# Patient Record
Sex: Male | Born: 1960
Health system: Southern US, Community
[De-identification: ages and names within clinical notes are randomized; demographics above are authoritative.]

## PROBLEM LIST (undated history)

## (undated) DIAGNOSIS — K2980 Duodenitis without bleeding: Secondary | ICD-10-CM

## (undated) DIAGNOSIS — N200 Calculus of kidney: Secondary | ICD-10-CM

---

## 1998-07-31 ENCOUNTER — Emergency Department (HOSPITAL_COMMUNITY): Admission: EM | Admit: 1998-07-31 | Discharge: 1998-07-31 | Payer: Self-pay | Admitting: Emergency Medicine

## 1998-07-31 ENCOUNTER — Encounter: Payer: Self-pay | Admitting: Emergency Medicine

## 1999-01-22 ENCOUNTER — Emergency Department (HOSPITAL_COMMUNITY): Admission: EM | Admit: 1999-01-22 | Discharge: 1999-01-22 | Payer: Self-pay | Admitting: Emergency Medicine

## 1999-01-31 ENCOUNTER — Emergency Department (HOSPITAL_COMMUNITY): Admission: EM | Admit: 1999-01-31 | Discharge: 1999-01-31 | Payer: Self-pay | Admitting: Emergency Medicine

## 2000-08-14 ENCOUNTER — Emergency Department (HOSPITAL_COMMUNITY): Admission: EM | Admit: 2000-08-14 | Discharge: 2000-08-14 | Payer: Self-pay | Admitting: Emergency Medicine

## 2002-04-20 ENCOUNTER — Ambulatory Visit (HOSPITAL_COMMUNITY): Admission: RE | Admit: 2002-04-20 | Discharge: 2002-04-20 | Payer: Self-pay | Admitting: Gastroenterology

## 2002-11-23 ENCOUNTER — Ambulatory Visit (HOSPITAL_COMMUNITY): Admission: RE | Admit: 2002-11-23 | Discharge: 2002-11-23 | Payer: Self-pay | Admitting: Family Medicine

## 2002-11-23 ENCOUNTER — Encounter: Payer: Self-pay | Admitting: Family Medicine

## 2005-05-08 ENCOUNTER — Ambulatory Visit (HOSPITAL_COMMUNITY): Admission: RE | Admit: 2005-05-08 | Discharge: 2005-05-08 | Payer: Self-pay | Admitting: General Surgery

## 2005-05-08 ENCOUNTER — Ambulatory Visit (HOSPITAL_BASED_OUTPATIENT_CLINIC_OR_DEPARTMENT_OTHER): Admission: RE | Admit: 2005-05-08 | Discharge: 2005-05-08 | Payer: Self-pay | Admitting: General Surgery

## 2005-05-08 ENCOUNTER — Encounter (INDEPENDENT_AMBULATORY_CARE_PROVIDER_SITE_OTHER): Payer: Self-pay | Admitting: *Deleted

## 2006-06-03 ENCOUNTER — Emergency Department (HOSPITAL_COMMUNITY): Admission: EM | Admit: 2006-06-03 | Discharge: 2006-06-04 | Payer: Self-pay | Admitting: Emergency Medicine

## 2009-04-17 ENCOUNTER — Emergency Department (HOSPITAL_COMMUNITY): Admission: EM | Admit: 2009-04-17 | Discharge: 2009-04-17 | Payer: Self-pay | Admitting: Emergency Medicine

## 2009-04-21 ENCOUNTER — Encounter: Admission: RE | Admit: 2009-04-21 | Discharge: 2009-04-21 | Payer: Self-pay | Admitting: Family Medicine

## 2010-12-16 LAB — URINALYSIS, ROUTINE W REFLEX MICROSCOPIC
Ketones, ur: 15 mg/dL — AB
Leukocytes, UA: NEGATIVE
Nitrite: NEGATIVE
Specific Gravity, Urine: 1.027 (ref 1.005–1.030)
Urobilinogen, UA: 0.2 mg/dL (ref 0.0–1.0)
pH: 5 (ref 5.0–8.0)

## 2010-12-16 LAB — URINE MICROSCOPIC-ADD ON

## 2011-01-26 NOTE — Op Note (Signed)
NAME:  Edward Gordon, Edward Gordon NO.:  [id number]   MEDICAL RECORD NO.:  [id number]          PATIENT TYPE:  AMB   LOCATION:  DSC                          FACILITY:  [facility]   PHYSICIAN:  Cherylynn Ridges, M.D.    DATE OF BIRTH:  1961/03/07   DATE OF PROCEDURE:  DATE OF DISCHARGE:                                 OPERATIVE REPORT   PREOPERATIVE DIAGNOSIS:  Pilonidal cyst disease.   POSTOPERATIVE DIAGNOSIS:  Pilonidal cyst disease.   OPERATION/PROCEDURE:  Excision of pilonidal cyst and sinus.   SURGEON:  Cherylynn Ridges, M.D.   ANESTHESIA:  General endotracheal anesthesia.   ESTIMATED BLOOD LOSS:  Less than 50 mL.   COMPLICATIONS:  None.   CONDITION:  Stable.   SPECIMENS:  Excised pilonidal disease area with skin.   INDICATIONS:  The patient is a 50 year old who has had recurrent pilonidal  cyst attacks with drainage.  Now comes in for definitive therapy.   FINDINGS:  The patient had minimal inflammation at the current time.  There  was induration in the sort of immediate posterior coccygeal area without any  evidence of infection.   DESCRIPTION OF PROCEDURE:  The patient was taken to the operating room,  placed on the table in the supine position.  An adequate general anesthesia  was administered.  He was flipped into the jackknife prone position and  placed on the table.  He was prepped with Betadine in the usual sterile  manner.  We marked the area of incision which ultimately measured 13 x 2-3  cm in size.  We made an oval incision around the diseased area using a #10  blade and then subsequently dissected it out down to the fascia using  electrocautery.  Hemostasis was obtained with electrocautery.  Once we  completely excised the tissue and sent it off as specimen, we attained  adequate hemostasis.  We injected with 0.25% Marcaine with epinephrine into  the wound and then we closed in three layers; a deep subcutaneous layer with  2-0 Vicryl interrupted simple  stitches  Placed.  A more superficial subcutaneous layer of tension-relieving 3-0  Vicryl was placed in the subcutaneous.  The skin was closed using  interrupted vertical mattress sutures of 2-0 nylon.  Sterile dressing was  applied including Betadine ointment.  All counts were correct.      Cherylynn Ridges, M.D.  Electronically Signed     JOW/MEDQ  D:  05/08/2005  T:  05/08/2005  Job:  [id number]

## 2011-01-26 NOTE — Op Note (Signed)
   NAME:  Edward Gordon, Edward Gordon                          ACCOUNT NO.:  [id number]   MEDICAL RECORD NO.:  [id number]                   PATIENT TYPE:  AMB   LOCATION:  ENDO                                 FACILITY:  [facility]   PHYSICIAN:  James L. Malon Kindle., M.D.          DATE OF BIRTH:  11-30-1960   DATE OF PROCEDURE:  04/20/2002  DATE OF DISCHARGE:                                 OPERATIVE REPORT   PROCEDURE:  Colonoscopy.   MEDICATIONS:  Fentanyl 80 mcg, Versed 8 mg IV.   ENDOSCOPE:  Olympus adult video colonoscope.   INDICATIONS:  Strong family history of colon cancer in the father,  grandmother, and multiple other family members have had colon polyps.  This  is done as a screening procedure.   DESCRIPTION OF PROCEDURE:  The procedure had been explained to the patient  and consent obtained.  The patient was in the left lateral decubitus  position and digital exam was performed, and the Olympus adult scope was  inserted and advanced under direct visualization.  The scope was withdrawn  after the cecum had been reached.  The ileocecal valve and appendiceal  orifice were seen.  The cecum, ascending colon, hepatic flexure, transverse  colon, splenic flexure, descending, and sigmoid colon were seen well upon  withdrawal, and no polyps were seen.  No significant diverticular disease.  There were moderate internal hemorrhoids seen in the rectum upon removal of  the scope.  The scope was withdrawn.  The patient tolerated the procedure  well.   ASSESSMENT:  1. No evidence of colon  polyps.  2. Internal hemorrhoids.   PLAN:  Will recommend repeating in five years and will recommend yearly  Hemoccults.                                                 James L. Malon Kindle., M.D.    Waldron Session  D:  04/20/2002  T:  04/22/2002  Job:  [id number]   cc:   Earl Lites D. Marina Goodell, M.D.

## 2011-01-29 ENCOUNTER — Emergency Department (HOSPITAL_BASED_OUTPATIENT_CLINIC_OR_DEPARTMENT_OTHER)
Admission: EM | Admit: 2011-01-29 | Discharge: 2011-01-29 | Disposition: A | Discharge status: Other Acute Inpatient Hospital | Payer: BC Managed Care – PPO | Attending: Emergency Medicine | Admitting: Emergency Medicine

## 2011-01-29 ENCOUNTER — Inpatient Hospital Stay (HOSPITAL_COMMUNITY)
Admission: AD | Admit: 2011-01-29 | Discharge: 2011-02-01 | DRG: 183 | Disposition: A | Discharge status: Home / Self Care | Payer: BC Managed Care – PPO | Source: Other Acute Inpatient Hospital | Attending: Internal Medicine | Admitting: Internal Medicine

## 2011-01-29 ENCOUNTER — Emergency Department (INDEPENDENT_AMBULATORY_CARE_PROVIDER_SITE_OTHER): Payer: BC Managed Care – PPO

## 2011-01-29 DIAGNOSIS — T3995XA Adverse effect of unspecified nonopioid analgesic, antipyretic and antirheumatic, initial encounter: Secondary | ICD-10-CM | POA: Diagnosis present

## 2011-01-29 DIAGNOSIS — R109 Unspecified abdominal pain: Secondary | ICD-10-CM

## 2011-01-29 DIAGNOSIS — Z87442 Personal history of urinary calculi: Secondary | ICD-10-CM

## 2011-01-29 DIAGNOSIS — K219 Gastro-esophageal reflux disease without esophagitis: Secondary | ICD-10-CM | POA: Diagnosis present

## 2011-01-29 DIAGNOSIS — K5900 Constipation, unspecified: Secondary | ICD-10-CM | POA: Diagnosis present

## 2011-01-29 DIAGNOSIS — R112 Nausea with vomiting, unspecified: Secondary | ICD-10-CM | POA: Diagnosis present

## 2011-01-29 DIAGNOSIS — M549 Dorsalgia, unspecified: Secondary | ICD-10-CM

## 2011-01-29 DIAGNOSIS — K2980 Duodenitis without bleeding: Principal | ICD-10-CM | POA: Diagnosis present

## 2011-01-29 LAB — BASIC METABOLIC PANEL
BUN: 10 mg/dL (ref 6–23)
CO2: 22 mEq/L (ref 19–32)
Chloride: 103 mEq/L (ref 96–112)
GFR calc non Af Amer: >60 mL/min (ref >60)
Glucose, Bld: 100 mg/dL — ABNORMAL HIGH (ref 70–99)
Potassium: 3.9 mEq/L (ref 3.5–5.1)
Sodium: 139 mEq/L (ref 135–145)

## 2011-01-29 LAB — HEPATIC FUNCTION PANEL
ALT: 26 U/L (ref 0–53)
AST: 26 U/L (ref 0–37)
Alkaline Phosphatase: 116 U/L (ref 39–117)
Bilirubin, Direct: 0.2 mg/dL (ref 0.0–0.3)
Total Bilirubin: 0.7 mg/dL (ref 0.3–1.2)

## 2011-01-29 LAB — CBC
HCT: 43.5 % (ref 39.0–52.0)
MCH: 30 pg (ref 26.0–34.0)
MCH: 30.2 pg (ref 26.0–34.0)
MCHC: 33.7 g/dL (ref 30.0–36.0)
MCV: 86 fL (ref 78.0–100.0)
MCV: 88.9 fL (ref 78.0–100.0)
Platelets: 266 10*3/uL (ref 150–400)
RBC: 4.67 MIL/uL (ref 4.22–5.81)
RBC: 5.06 MIL/uL (ref 4.22–5.81)
WBC: 16.3 10*3/uL — ABNORMAL HIGH (ref 4.0–10.5)

## 2011-01-29 LAB — DIFFERENTIAL
Eosinophils Absolute: 0.2 10*3/uL (ref 0.0–0.7)
Lymphocytes Relative: 6 % — ABNORMAL LOW (ref 12–46)
Lymphs Abs: 1 10*3/uL (ref 0.7–4.0)
Monocytes Relative: 9 % (ref 3–12)
Neutrophils Relative %: 84 % — ABNORMAL HIGH (ref 43–77)

## 2011-01-29 LAB — URINALYSIS, ROUTINE W REFLEX MICROSCOPIC
Bilirubin Urine: NEGATIVE
Glucose, UA: NEGATIVE mg/dL
Hgb urine dipstick: NEGATIVE
Ketones, ur: NEGATIVE mg/dL
Specific Gravity, Urine: 1.009 (ref 1.005–1.030)
pH: 7.5 (ref 5.0–8.0)

## 2011-01-29 MED ORDER — IOHEXOL 300 MG/ML  SOLN
100.0000 mL | Freq: Once | INTRAMUSCULAR | Status: AC | PRN
  Administered 2011-01-29: 100 mL via INTRAVENOUS

## 2011-01-30 LAB — GLUCOSE, CAPILLARY: Glucose-Capillary: 89 mg/dL (ref 70–99)

## 2011-01-30 LAB — COMPREHENSIVE METABOLIC PANEL
Albumin: 3 g/dL — ABNORMAL LOW (ref 3.5–5.2)
BUN: 9 mg/dL (ref 6–23)
CO2: 23 mEq/L (ref 19–32)
Calcium: 8.8 mg/dL (ref 8.4–10.5)
Chloride: 102 mEq/L (ref 96–112)
Creatinine, Ser: 1.21 mg/dL (ref 0.4–1.5)
GFR calc non Af Amer: >60 mL/min (ref >60)
Total Bilirubin: 0.7 mg/dL (ref 0.3–1.2)

## 2011-01-31 LAB — LIPASE, BLOOD: Lipase: 39 U/L (ref 11–59)

## 2011-01-31 LAB — BASIC METABOLIC PANEL
Calcium: 8.3 mg/dL — ABNORMAL LOW (ref 8.4–10.5)
GFR calc Af Amer: >60 mL/min (ref >60)
GFR calc non Af Amer: >60 mL/min (ref >60)
Sodium: 134 mEq/L — ABNORMAL LOW (ref 135–145)

## 2011-01-31 LAB — GLUCOSE, CAPILLARY: Glucose-Capillary: 110 mg/dL — ABNORMAL HIGH (ref 70–99)

## 2011-01-31 LAB — H. PYLORI ANTIBODY, IGG: H Pylori IgG: <0.4 {ISR}

## 2011-01-31 LAB — CBC
MCHC: 34.5 g/dL (ref 30.0–36.0)
RDW: 12.3 % (ref 11.5–15.5)

## 2011-02-01 LAB — BASIC METABOLIC PANEL
Calcium: 8.6 mg/dL (ref 8.4–10.5)
GFR calc Af Amer: >60 mL/min (ref >60)
GFR calc non Af Amer: >60 mL/min (ref >60)
Glucose, Bld: 119 mg/dL — ABNORMAL HIGH (ref 70–99)
Sodium: 133 mEq/L — ABNORMAL LOW (ref 135–145)

## 2011-02-01 LAB — CBC
MCHC: 34.5 g/dL (ref 30.0–36.0)
RDW: 12.2 % (ref 11.5–15.5)

## 2011-02-04 ENCOUNTER — Emergency Department (HOSPITAL_BASED_OUTPATIENT_CLINIC_OR_DEPARTMENT_OTHER)
Admission: EM | Admit: 2011-02-04 | Discharge: 2011-02-04 | Disposition: A | Discharge status: Home / Self Care | Payer: BC Managed Care – PPO | Attending: Emergency Medicine | Admitting: Emergency Medicine

## 2011-02-04 ENCOUNTER — Emergency Department (INDEPENDENT_AMBULATORY_CARE_PROVIDER_SITE_OTHER): Payer: BC Managed Care – PPO

## 2011-02-04 DIAGNOSIS — Z79899 Other long term (current) drug therapy: Secondary | ICD-10-CM | POA: Insufficient documentation

## 2011-02-04 DIAGNOSIS — K219 Gastro-esophageal reflux disease without esophagitis: Secondary | ICD-10-CM | POA: Insufficient documentation

## 2011-02-04 DIAGNOSIS — M79609 Pain in unspecified limb: Secondary | ICD-10-CM

## 2011-02-04 DIAGNOSIS — IMO0002 Reserved for concepts with insufficient information to code with codable children: Secondary | ICD-10-CM | POA: Insufficient documentation

## 2011-02-04 DIAGNOSIS — M7989 Other specified soft tissue disorders: Secondary | ICD-10-CM

## 2011-02-04 LAB — DIFFERENTIAL
Basophils Absolute: 0.1 10*3/uL (ref 0.0–0.1)
Eosinophils Relative: 6 % — ABNORMAL HIGH (ref 0–5)
Lymphocytes Relative: 23 % (ref 12–46)
Neutro Abs: 6 10*3/uL (ref 1.7–7.7)

## 2011-02-04 LAB — BASIC METABOLIC PANEL
CO2: 23 mEq/L (ref 19–32)
Calcium: 9.7 mg/dL (ref 8.4–10.5)
GFR calc Af Amer: >60 mL/min (ref >60)
GFR calc non Af Amer: >60 mL/min (ref >60)
Sodium: 142 mEq/L (ref 135–145)

## 2011-02-04 LAB — CBC
Hemoglobin: 15.2 g/dL (ref 13.0–17.0)
MCHC: 35.2 g/dL (ref 30.0–36.0)

## 2011-02-07 NOTE — Discharge Summary (Signed)
NAME:  Edward Gordon, Edward Gordon              ACCOUNT NO.:  [id number]  MEDICAL RECORD NO.:  [id number]           PATIENT TYPE:  I  LOCATION:  1343                         FACILITY:  [facility]  PHYSICIAN:  Thad Ranger, MD       DATE OF BIRTH:  [birth year]  DATE OF ADMISSION:  01/29/2011 DATE OF DISCHARGE:                        DISCHARGE SUMMARY - REFERRING   PRIMARY CARE PHYSICIAN:  Tally Joe, M.D.  DISCHARGE DIAGNOSES: 1. Acute duodenitis. 2. Severe gastroesophageal reflux disease. 3. History of kidney stones. 4. Abdominal pain with nausea and vomiting, improved.  CONSULTATIONS:  Gastroenterology, Graylin Shiver, M.D.  DISCHARGE MEDICATIONS: 1. Ciprofloxacin 500 mg p.o. b.i.d. for 7 days. 2. Flagyl 500 mg p.o. t.i.d. with meals for 7 days. 3. Percocet 5/325 mg one to two tablets p.o. every 6 hours as needed     for pain. 4. Phenergan 12.5 mg p.o. every 6 hours as needed for nausea/vomiting. 5. Protonix 40 mg p.o. b.i.d. with meals. 6. Sucralfate 1 g p.o. with meals and at bedtime. 7. Tramadol 50 mg p.o. every 8 hours as needed for pain. 8. Metamucil daily. 9. Multivitamin one tablet p.o. daily. 10.Tacrolimus topical daily as needed.  The patient was counseled to stop the following medications which include:  Ibuprofen, Nexium and doxycycline.  BRIEF HISTORY OF PRESENT ILLNESS:  At the time of admission Edward Gordon is a 50 year old male with history of severe GERD who came in with severe abdominal pain, presumed to be kidney stones that he had in the past.  He had associated nausea, vomiting and flank pain.  He denied any fever or chills.  He also felt constipated, but no diarrhea, no hematemesis, no melena, and no bright red blood per rectum.  RADIOLOGICAL DATA:  CT of the abdomen and pelvis on May 21 showed: 1. Diffuse inflammatory process within the third portion of the     duodenum, compatible with acute duodenitis. 2. Minimal fullness of the right renal collecting system,  may be     secondary to some impact on the proximal right ureter which extends     into the inflammatory retroperitoneal process. 3. Extensive fluid and inflammatory change within the retroperitoneum     extending inferiorly from the level of duodenum. 4. No definitive free air to suggest perforation. 5. Several segments in the midportion of the small bowel, left distal     small bowel, and colon appears to be normal.  Ill-defined airspace     disease in the right middle lobe.  While this may represent     atelectasis, infection is also considered.  BRIEF HOSPITALIZATION COURSE:  Edward Gordon is a 50 year old male with history of severe GERD who presented with abdominal pain, nausea, and vomiting. Acute duodenitis with severe GERD, likely secondary to his NSAID use. Duodenitis is somewhat unclear in etiology.  The patient was admitted to the Medicine Service.  He was started on ciprofloxacin and Flagyl IV. The patient was initially placed on n.p.o. status, IV fluids and IV Protonix drip.  Gastroenterology was consulted and the patient was followed by Dr. Evette Cristal.  He did not require EGD during the hospitalization  secondary to marked improvement in his symptoms.  At the time of discharge, the patient is eating a regular diet without any difficulty.  He will follow up with Gastroenterology within the next one to two weeks and likely need a screening endoscopy given his long history of GERD for any peptic ulcer disease, any duodenal ulcer or Barrett's esophagus.  This was clearly explained to the patient by myself at the time of discharge.  PHYSICAL EXAMINATION:  VITAL SIGNS:  At the time of the dictation, blood pressure 135/87, temperature 98.8, pulse 79, respirations 16, O2 saturations 98% on room air. GENERAL:  The patient is alert, awake and oriented x3, not in acute distress. HEENT:  Anicteric sclerae.  Pink conjunctivae.  Pupils reactive to light and accommodation.  EOMI. NECK:   Supple.  No lymphopathy.  No JVD. CARDIOVASCULAR:  S1 and S2 regular.  Regular rate and rhythm. CHEST:  Clear to auscultation bilaterally. ABDOMEN:  Soft, nontender, normal bowel sounds. EXTREMITIES:  No cyanosis, clubbing or edema noted in the upper or lower extremities bilaterally.  DISCHARGE FOLLOWUP:  Follow up with Dr. Azucena Cecil in the next two weeks and [facility], Dr. Evette Cristal, in the next two weeks.  Discharge time 35 minutes.     Thad Ranger, MD     RR/MEDQ  D:  02/01/2011  T:  02/01/2011  Job:  [id number]  cc:   Tally Joe, M.D. Fax: [phone]  Graylin Shiver, M.D. Fax: [phone]  Electronically Signed by Jules Vidovich  on 02/07/2011 10:28:07 AM

## 2011-02-07 NOTE — Consult Note (Signed)
NAME:  Edward Gordon, Edward Gordon NO.:  [id number]  MEDICAL RECORD NO.:  [id number]           PATIENT TYPE:  LOCATION:                                 FACILITY:  PHYSICIAN:  Graylin Shiver, M.D.   DATE OF BIRTH:  [birth year]  DATE OF CONSULTATION: DATE OF DISCHARGE:                                CONSULTATION   REASON FOR CONSULTATION:  The patient is a 50 year old male who was admitted to the hospital yesterday after having gone to the emergency room at [facility] because of ongoing abdominal pain.  The patient states that he had been experiencing severe abdominal pain for approximately 4 days which did not seem to get better, so he went to the emergency room.  He thought that this pain was related to kidney stones which he has had in the past, although those pains from kidney stones were in his back.  The pain this time in his abdomen was more in the mid abdomen.  It was associated with nausea and 1 episode of vomiting, but no hematemesis.  There was no diarrhea.  When he went to the emergency room, he was found to have an elevated white blood cell count of 16,300. Hemoglobin was 15.3.  A CT scan of the abdomen and pelvis was done, which showed a diffuse inflammatory process within the third portion of the duodenum compatible with acute duodenitis.  There was extensive fluid and inflammatory change within the retroperitoneum extending inferiorly from the level of the duodenum.  There was no definite free air to suggest perforation.  The patient states that when he first got this abdominal pain, he took 5 ibuprofen.  Currently having been in the hospital now for about a little over 12 hours, he is feeling better.  He was started on a proton pump inhibitor as well as 2 antibiotics, Cipro and Flagyl.  He states that the abdominal pain is better and he is not nauseated or vomiting today.  ALLERGIES:  PENICILLIN.  MEDICATIONS PRIOR TO ADMISSION:  Doxycycline and  omeprazole.  PAST MEDICAL HISTORY:  Positive for GERD and kidney stones.  There is also a history of MRSA.  SOCIAL HISTORY:  He does not smoke or drink alcohol.  REVIEW OF SYSTEMS:  Systems review otherwise negative.  PHYSICAL EXAMINATION:  GENERAL:  He is alert and oriented and does not appear in any acute distress. VITAL SIGNS:  Stable. HEENT:  He is nonicteric. HEART:  Regular rhythm.  No murmurs. LUNGS:  Clear. ABDOMEN:  Bowel sounds are normal.  It is soft.  He does have some tenderness along the right midabdomen, but there is no rigidity or rebound pain.  IMPRESSION:  Abdominal pain with abnormal CT scan showing evidence of duodenitis.  PLAN:  The etiology of this problem is unclear.  I would suggest that since current treatment does seem to be improving his symptoms, that it be continued.  I would continue antibiotics currently and also pantoprazole and follow him clinically.  We may consider EGD.  However, right now I think the thing we need to do is to continue treatment and follow him  clinically.          ______________________________ Graylin Shiver, M.D.     SFG/MEDQ  D:  01/30/2011  T:  01/30/2011  Job:  [id number]  cc:   [facility]  Tally Joe, M.D. Fax: [phone]  Electronically Signed by Herbert Moors MD on 02/07/2011 10:04:17 AM

## 2011-02-08 NOTE — H&P (Signed)
NAME:  Edward Gordon, Edward Gordon              ACCOUNT NO.:  [id number]  MEDICAL RECORD NO.:  [id number]           PATIENT TYPE:  I  LOCATION:  1343                         FACILITY:  [facility]  PHYSICIAN:  Lonia Blood, M.D.      DATE OF BIRTH:  06/16/1961  DATE OF ADMISSION:  01/29/2011 DATE OF DISCHARGE:                             HISTORY & PHYSICAL   PRIMARY CARE PHYSICIAN:  Dr. Inge Rise  PRESENTING COMPLAINT:  Abdominal pain, nausea, and vomiting.  HISTORY OF PRESENT ILLNESS:  The patient is a 50 year old gentleman with a history of severe GERD, who came in with a history of severe abdominal pain presumed to be kidney stones that he had in the past.  He has associated nausea, vomiting, and flank pain.  He denied any fever or chills.  He has also felt constipated, but no diarrhea.  No hematemesis. No melena.  No bright red blood per rectum.  He denied any other symptoms.  He apparently has had some treatment for MRSA and has been on doxycycline for suppression treatment.  PAST MEDICAL HISTORY:  Significant for: 1. GERD. 2. Kidney stones.  ALLERGIES:  PENICILLIN.  MEDICATIONS:  Doxycycline and Nexium.  SOCIAL HISTORY:  The patient lives here in [place].  He denied any tobacco, alcohol, or IV drug use.  FAMILY HISTORY:  He denied any significant family history.  REVIEW OF SYSTEMS:  All systems reviewed are negative except per HPI.  PHYSICAL EXAMINATION:  VITAL SIGNS:  Temperature is 100.0, blood pressure 129/83, pulse 103, respiratory rate 16, sats 97% on room air. GENERAL:  He is awake, alert, oriented.  He is in no acute distress. HEENT:  PERRL.  EOMI.  No pallor, no jaundice.  No rhinorrhea. NECK:  Supple.  No visible JVD, no lymphadenopathy. RESPIRATORY:  He has good air entry bilaterally.  No wheezes.  No rales. No crackles. CARDIOVASCULAR SYSTEM:  He has S1 and S2.  No audible murmur. ABDOMEN:  Soft, distended, full, mild epigastric tenderness.  No organomegaly with  positive bowel sounds. EXTREMITIES:  No edema, cyanosis, or clubbing. SKIN EXAM:  No rashes.  No ulcers.  LABORATORY DATA:  White count is 16.3, hemoglobin 15.3, platelet count is 298 with left shift, ANC of 13.7.  Urinalysis is negative.  Sodium is 139, potassium 3.9, chloride 103, CO2 of 22, glucose 100, BUN 10, creatinine 1.20, calcium 9.9, total protein 7.7, albumin 3.8, AST 26, ALT 26.  Lipase is 79.  CT abdomen and pelvis showed diffuse inflammatory process within the third portion of the duodenum which is compatible with an acute duodenitis.  There was minimal fullness of the right renal collecting system which may be secondary to some impact on the proximal right ureter which extended to the inflammatory retroperitoneal process, extensive fluid, and inflammatory change within the retroperitoneum extending inferiorly from the level of the duodenum. There is no definite free air to suggest perforation.  ASSESSMENT:  This is a 50 year old gentleman presenting with what appears to be acute duodenitis.  The patient is currently responding to treatment.  This could be infectious versus inflammatory in nature, but he has severe gastroesophageal  reflux disease apparently.  PLAN:  Therefore, 1. Acute duodenitis.  We will admit the patient, start him on empiric     Cipro and Flagyl and see how the patient responds to these.  In the     meantime, I will keep him n.p.o. and also consider GI consult.     Possible right middle lobe pneumonia according to his CT abdomen     and pelvis.  Again, we will repeat a chest x-ray in the morning.     If he shows definite pneumonia, we will change his antibiotics. 2. GERD.  I will continue his PPI twice a day. 3. History of kidney stones.  His CT did not show any evidence of     kidney stone at this point.     Lonia Blood, M.D.     Verlin Grills  D:  01/29/2011  T:  01/29/2011  Job:  [id number]  Electronically Signed by Lonia Blood M.D. on 02/08/2011  11:51:14 AM

## 2011-05-27 ENCOUNTER — Encounter: Payer: Self-pay | Admitting: *Deleted

## 2011-05-27 ENCOUNTER — Emergency Department (HOSPITAL_BASED_OUTPATIENT_CLINIC_OR_DEPARTMENT_OTHER)
Admission: EM | Admit: 2011-05-27 | Discharge: 2011-05-27 | Disposition: A | Discharge status: Home / Self Care | Payer: BC Managed Care – PPO | Attending: Emergency Medicine | Admitting: Emergency Medicine

## 2011-05-27 ENCOUNTER — Emergency Department (INDEPENDENT_AMBULATORY_CARE_PROVIDER_SITE_OTHER): Payer: BC Managed Care – PPO

## 2011-05-27 DIAGNOSIS — K449 Diaphragmatic hernia without obstruction or gangrene: Secondary | ICD-10-CM

## 2011-05-27 DIAGNOSIS — N390 Urinary tract infection, site not specified: Secondary | ICD-10-CM | POA: Insufficient documentation

## 2011-05-27 DIAGNOSIS — M545 Low back pain: Secondary | ICD-10-CM

## 2011-05-27 DIAGNOSIS — R109 Unspecified abdominal pain: Secondary | ICD-10-CM

## 2011-05-27 DIAGNOSIS — N200 Calculus of kidney: Secondary | ICD-10-CM

## 2011-05-27 DIAGNOSIS — N201 Calculus of ureter: Secondary | ICD-10-CM | POA: Insufficient documentation

## 2011-05-27 DIAGNOSIS — R110 Nausea: Secondary | ICD-10-CM | POA: Insufficient documentation

## 2011-05-27 HISTORY — DX: Calculus of kidney: N20.0

## 2011-05-27 LAB — URINE MICROSCOPIC-ADD ON

## 2011-05-27 LAB — URINALYSIS, ROUTINE W REFLEX MICROSCOPIC
Nitrite: POSITIVE — AB
Specific Gravity, Urine: 1.031 — ABNORMAL HIGH (ref 1.005–1.030)
Urobilinogen, UA: 1 mg/dL (ref 0.0–1.0)
pH: 5 (ref 5.0–8.0)

## 2011-05-27 LAB — BASIC METABOLIC PANEL
BUN: 17 mg/dL (ref 6–23)
Chloride: 104 mEq/L (ref 96–112)
GFR calc Af Amer: >60 mL/min (ref >60)
GFR calc non Af Amer: >60 mL/min (ref >60)
Potassium: 3.7 mEq/L (ref 3.5–5.1)

## 2011-05-27 MED ORDER — CIPROFLOXACIN HCL 500 MG PO TABS: 500.0000 mg | ORAL_TABLET | Freq: Two times a day (BID) | ORAL | Status: AC

## 2011-05-27 MED ORDER — ONDANSETRON HCL 4 MG/2ML IJ SOLN
4.0000 mg | Freq: Once | INTRAMUSCULAR | Status: AC
  Administered 2011-05-27: 4 mg via INTRAVENOUS
  Filled 2011-05-27: qty 2

## 2011-05-27 MED ORDER — ONDANSETRON HCL 4 MG PO TABS: 4.0000 mg | ORAL_TABLET | Freq: Four times a day (QID) | ORAL | Status: AC

## 2011-05-27 MED ORDER — HYDROMORPHONE HCL 1 MG/ML IJ SOLN
1.0000 mg | Freq: Once | INTRAMUSCULAR | Status: AC
  Administered 2011-05-27: 1 mg via INTRAVENOUS
  Filled 2011-05-27: qty 1

## 2011-05-27 MED ORDER — OXYCODONE-ACETAMINOPHEN 5-325 MG PO TABS: 2.0000 | ORAL_TABLET | ORAL | Status: AC | PRN

## 2011-05-27 NOTE — ED Notes (Signed)
Pt has hx of kidney stones and this one started giving him problems yesterday.

## 2011-05-27 NOTE — ED Notes (Signed)
rx x 3 for cipro, zofran and oxycodone given- family present to drive- urine strainer given for home use

## 2011-05-27 NOTE — ED Provider Notes (Signed)
History     CSN: [id number] Arrival date & time: 05/27/2011  1:22 PM   Chief Complaint  Patient presents with  . Nephrolithiasis     (Include location/radiation/quality/duration/timing/severity/associated sxs/prior treatment) HPI Comments: Pt states that he has a history of kidney stones and it feels similar:pt c/o right flank pain with radiation to the abdomen  Patient is a 50 y.o. male presenting with flank pain. The history is provided by the patient. No language interpreter was used.  Flank Pain This is a recurrent problem. The current episode started yesterday. The problem occurs intermittently. The problem has been rapidly worsening. Associated symptoms include nausea. Pertinent negatives include no fever, urinary symptoms or vomiting. The symptoms are aggravated by nothing. He has tried nothing for the symptoms.     Past Medical History  Diagnosis Date  . Kidney stones      History reviewed. No pertinent past surgical history.  History reviewed. No pertinent family history.  History  Substance Use Topics  . Smoking status: Never Smoker   . Smokeless tobacco: Not on file  . Alcohol Use: No      Review of Systems  Constitutional: Negative for fever.  Gastrointestinal: Positive for nausea. Negative for vomiting.  Genitourinary: Positive for flank pain.  All other systems reviewed and are negative.    Allergies  Review of patient's allergies indicates no known allergies.  Home Medications   Current Outpatient Rx  Name Route Sig Dispense Refill  . DOXYCYCLINE HYCLATE 100 MG PO CAPS Oral Take 100 mg by mouth daily.      Marland Kitchen PANTOPRAZOLE SODIUM 20 MG PO TBEC Oral Take 20 mg by mouth daily.        Physical Exam    BP 142/96  Pulse 64  Temp(Src) 97.5 F (36.4 C) (Oral)  Resp 20  Ht 6\' 2"  (1.88 m)  Wt 195 lb (88.451 kg)  BMI 25.04 kg/m2  SpO2 100%  Physical Exam  Nursing note and vitals reviewed. Constitutional: He is oriented to person, place, and  time. He appears well-developed and well-nourished.  HENT:  Head: Normocephalic.  Neck: Normal range of motion.  Cardiovascular: Normal rate and regular rhythm.   Pulmonary/Chest: Effort normal and breath sounds normal.  Abdominal: Soft. Bowel sounds are normal.  Musculoskeletal: Normal range of motion.  Neurological: He is alert and oriented to person, place, and time.  Skin: Skin is warm and dry.  Psychiatric: He has a normal mood and affect.    ED Course  Procedures  Results for orders placed during the hospital encounter of 05/27/11  URINALYSIS, ROUTINE W REFLEX MICROSCOPIC      Component Value Range   Color, Urine ORANGE (*) YELLOW    Appearance CLOUDY (*) CLEAR    Specific Gravity, Urine 1.031 (*) 1.005 - 1.030    pH 5.0  5.0 - 8.0    Glucose, UA NEGATIVE  NEGATIVE (mg/dL)   Hgb urine dipstick LARGE (*) NEGATIVE    Bilirubin Urine SMALL (*) NEGATIVE    Ketones, ur 15 (*) NEGATIVE (mg/dL)   Protein, ur NEGATIVE  NEGATIVE (mg/dL)   Urobilinogen, UA 1.0  0.0 - 1.0 (mg/dL)   Nitrite POSITIVE (*) NEGATIVE    Leukocytes, UA SMALL (*) NEGATIVE   BASIC METABOLIC PANEL      Component Value Range   Sodium 142  135 - 145 (mEq/L)   Potassium 3.7  3.5 - 5.1 (mEq/L)   Chloride 104  96 - 112 (mEq/L)   CO2 27  19 - 32 (mEq/L)   Glucose, Bld 95  70 - 99 (mg/dL)   BUN 17  6 - 23 (mg/dL)   Creatinine, Ser 0.86  0.50 - 1.35 (mg/dL)   Calcium 9.8  8.4 - 57.8 (mg/dL)   GFR calc non Af Amer >60  >60 (mL/min)   GFR calc Af Amer >60  >60 (mL/min)  URINE MICROSCOPIC-ADD ON      Component Value Range   Squamous Epithelial / LPF RARE  RARE    WBC, UA 3-6  <3 (WBC/hpf)   RBC / HPF TOO NUMEROUS TO COUNT  <3 (RBC/hpf)   Bacteria, UA MANY (*) RARE    Crystals CA OXALATE CRYSTALS (*) NEGATIVE    Urine-Other MUCOUS PRESENT     No results found.  Results for orders placed during the hospital encounter of 05/27/11  URINALYSIS, ROUTINE W REFLEX MICROSCOPIC      Component Value Range    Color, Urine ORANGE (*) YELLOW    Appearance CLOUDY (*) CLEAR    Specific Gravity, Urine 1.031 (*) 1.005 - 1.030    pH 5.0  5.0 - 8.0    Glucose, UA NEGATIVE  NEGATIVE (mg/dL)   Hgb urine dipstick LARGE (*) NEGATIVE    Bilirubin Urine SMALL (*) NEGATIVE    Ketones, ur 15 (*) NEGATIVE (mg/dL)   Protein, ur NEGATIVE  NEGATIVE (mg/dL)   Urobilinogen, UA 1.0  0.0 - 1.0 (mg/dL)   Nitrite POSITIVE (*) NEGATIVE    Leukocytes, UA SMALL (*) NEGATIVE   BASIC METABOLIC PANEL      Component Value Range   Sodium 142  135 - 145 (mEq/L)   Potassium 3.7  3.5 - 5.1 (mEq/L)   Chloride 104  96 - 112 (mEq/L)   CO2 27  19 - 32 (mEq/L)   Glucose, Bld 95  70 - 99 (mg/dL)   BUN 17  6 - 23 (mg/dL)   Creatinine, Ser 4.69  0.50 - 1.35 (mg/dL)   Calcium 9.8  8.4 - 62.9 (mg/dL)   GFR calc non Af Amer >60  >60 (mL/min)   GFR calc Af Amer >60  >60 (mL/min)  URINE MICROSCOPIC-ADD ON      Component Value Range   Squamous Epithelial / LPF RARE  RARE    WBC, UA 3-6  <3 (WBC/hpf)   RBC / HPF TOO NUMEROUS TO COUNT  <3 (RBC/hpf)   Bacteria, UA MANY (*) RARE    Crystals CA OXALATE CRYSTALS (*) NEGATIVE    Urine-Other MUCOUS PRESENT     Ct Abdomen Pelvis Wo Contrast  05/27/2011  *RADIOLOGY REPORT*  Clinical Data: History of renal stones, now with low back and right- sided flank pain for 1 day  CT ABDOMEN AND PELVIS WITHOUT CONTRAST  Technique:  Multidetector CT imaging of the abdomen and pelvis was performed following the standard protocol without intravenous contrast.  Comparison: 01/29/2011  Findings:  Normal hepatic contour.  Normal gallbladder.  No ascites.  There is an approximately 6 mm stone either within the right UVJ or within the urinary bladder.  There is mild right-sided ureterectasis and periureteral stranding with mild right-sided pelvocaliectasis.  Multiple additional punctate (1-2 mm) nonobstructing stones are identified bilaterally.  There is no evidence of right-sided obstruction.  The noncontrast  appearance of the bilateral adrenal glands, pancreas and spleen is normal. Incidental note is made of a small splenule.  Small hiatal hernia.  The bowel is normal in course and caliber without wall thickening or evidence of obstruction.  Normal appendix.  No pneumoperitoneum, pneumatosis or portal venous gas. Normal caliber of the abdominal aorta.  Scattered shoddy retroperitoneal lymph nodes are not enlarged by CT criteria with index right iliac lymph node measuring 6 mm in short axis diameter (image 52).  No retroperitoneal, mesenteric, pelvic or inguinal lymphadenopathy.  No free fluid within the pelvis.  Limited visualization of the lower thorax demonstrates dependent ground-glass atelectasis.  No focal airspace opacities.  No pleural effusion.  Normal heart size.  No pericardial effusion.  No acute aggressive osseous abnormalities.  IMPRESSION: 1.  Approximately 6 mm stone either within the right UVJ or within the bladder with assoicated mild right-sided ureterectasis and pelvicaliectasis. 2.  Multiple additional punctate (1-2 mm) nonobstructing stones are identified bilaterally.  Original Report Authenticated By: Waynard Reeds, M.D.     No diagnosis found.   MDM Pt comfortable at this time:will treat for infection in urine:pt is afebrile:stone seems to have passed       Teressa Lower, NP 05/27/11 1508

## 2011-05-29 NOTE — ED Provider Notes (Signed)
History/physical exam/procedure(s) were performed by non-physician practitioner and as supervising physician I was immediately available for consultation/collaboration. I have reviewed all notes and am in agreement with care and plan.   Hilario Quarry, MD 05/29/11 [phone]

## 2014-08-23 ENCOUNTER — Other Ambulatory Visit (HOSPITAL_COMMUNITY): Payer: Self-pay | Admitting: Family Medicine

## 2014-08-23 ENCOUNTER — Ambulatory Visit (HOSPITAL_COMMUNITY)
Admission: RE | Admit: 2014-08-23 | Discharge: 2014-08-23 | Disposition: A | Discharge status: Home / Self Care | Payer: BC Managed Care – PPO | Source: Ambulatory Visit | Attending: Family Medicine | Admitting: Family Medicine

## 2014-08-23 DIAGNOSIS — M25571 Pain in right ankle and joints of right foot: Secondary | ICD-10-CM | POA: Insufficient documentation

## 2014-08-23 DIAGNOSIS — W11XXXA Fall on and from ladder, initial encounter: Secondary | ICD-10-CM | POA: Insufficient documentation

## 2014-12-26 ENCOUNTER — Emergency Department (HOSPITAL_COMMUNITY)
Admission: EM | Admit: 2014-12-26 | Discharge: 2014-12-26 | Disposition: A | Discharge status: Home / Self Care | Payer: BLUE CROSS/BLUE SHIELD | Attending: Emergency Medicine | Admitting: Emergency Medicine

## 2014-12-26 ENCOUNTER — Encounter (HOSPITAL_COMMUNITY): Payer: Self-pay

## 2014-12-26 ENCOUNTER — Encounter (HOSPITAL_COMMUNITY): Payer: Self-pay | Admitting: Emergency Medicine

## 2014-12-26 ENCOUNTER — Emergency Department (HOSPITAL_COMMUNITY)
Admission: EM | Admit: 2014-12-26 | Discharge: 2014-12-27 | Disposition: A | Discharge status: Home / Self Care | Payer: BLUE CROSS/BLUE SHIELD | Attending: Emergency Medicine | Admitting: Emergency Medicine

## 2014-12-26 DIAGNOSIS — Z792 Long term (current) use of antibiotics: Secondary | ICD-10-CM | POA: Diagnosis not present

## 2014-12-26 DIAGNOSIS — K2980 Duodenitis without bleeding: Secondary | ICD-10-CM | POA: Insufficient documentation

## 2014-12-26 DIAGNOSIS — Z79899 Other long term (current) drug therapy: Secondary | ICD-10-CM | POA: Diagnosis not present

## 2014-12-26 DIAGNOSIS — R040 Epistaxis: Secondary | ICD-10-CM | POA: Diagnosis not present

## 2014-12-26 DIAGNOSIS — Z87442 Personal history of urinary calculi: Secondary | ICD-10-CM | POA: Insufficient documentation

## 2014-12-26 DIAGNOSIS — Z880 Allergy status to penicillin: Secondary | ICD-10-CM | POA: Insufficient documentation

## 2014-12-26 DIAGNOSIS — R531 Weakness: Secondary | ICD-10-CM | POA: Diagnosis not present

## 2014-12-26 DIAGNOSIS — Z8719 Personal history of other diseases of the digestive system: Secondary | ICD-10-CM | POA: Diagnosis not present

## 2014-12-26 HISTORY — DX: Duodenitis without bleeding: K29.80

## 2014-12-26 LAB — CBC WITH DIFFERENTIAL/PLATELET
BASOS PCT: 1 % (ref 0–1)
Basophils Absolute: 0.1 10*3/uL (ref 0.0–0.1)
EOS PCT: 9 % — AB (ref 0–5)
Eosinophils Absolute: 0.5 10*3/uL (ref 0.0–0.7)
HCT: 45.3 % (ref 39.0–52.0)
Hemoglobin: 15.1 g/dL (ref 13.0–17.0)
Lymphocytes Relative: 24 % (ref 12–46)
Lymphs Abs: 1.5 10*3/uL (ref 0.7–4.0)
MCH: 29.3 pg (ref 26.0–34.0)
MCHC: 33.3 g/dL (ref 30.0–36.0)
MCV: 87.8 fL (ref 78.0–100.0)
MONOS PCT: 8 % (ref 3–12)
Monocytes Absolute: 0.5 10*3/uL (ref 0.1–1.0)
Neutro Abs: 3.6 10*3/uL (ref 1.7–7.7)
Neutrophils Relative %: 58 % (ref 43–77)
PLATELETS: 263 10*3/uL (ref 150–400)
RBC: 5.16 MIL/uL (ref 4.22–5.81)
RDW: 12.5 % (ref 11.5–15.5)
WBC: 6.2 10*3/uL (ref 4.0–10.5)

## 2014-12-26 MED ORDER — OXYMETAZOLINE HCL 0.05 % NA SOLN: 1.0000 | Freq: Once | NASAL | Status: DC

## 2014-12-26 MED ORDER — OXYMETAZOLINE HCL 0.05 % NA SOLN
1.0000 | Freq: Once | NASAL | Status: DC
  Filled 2014-12-26: qty 15

## 2014-12-26 NOTE — ED Notes (Signed)
He states he experienced sudden onset of right-sided epistaxis this morning.  He reports having recent "sinus infection".  He is in no distress.

## 2014-12-26 NOTE — Discharge Instructions (Signed)
Nosebleed Get saline nasal spray and spray into each nostril once every 2 hours while awake for the next 3 days. If your nose rebleeds sit upright in a chair and pinch your nostrils shut with your fingers for 20 minutes. If you can't get the bleeding to stop,  Return here A nosebleed can be caused by many things, including:  Getting hit hard in the nose.  Infections.  Dry nose.  Colds.  Medicines. Your doctor may do lab testing if you get nosebleeds a lot and the cause is not known. HOME CARE   If your nose was packed with material, keep it there until your doctor takes it out. Put the pack back in your nose if the pack falls out.  Do not blow your nose for 12 hours after the nosebleed.  Sit up and bend forward if your nose starts bleeding again. Pinch the front half of your nose nonstop for 20 minutes.  Put petroleum jelly inside your nose every morning if you have a dry nose.  Use a humidifier to make the air less dry.  Do not take aspirin.  Try not to strain, lift, or bend at the waist for many days after the nosebleed. GET HELP RIGHT AWAY IF:   Nosebleeds keep happening and are hard to stop or control.  You have bleeding or bruises that are not normal on other parts of the body.  You have a fever.  The nosebleeds get worse.  You get lightheaded, feel faint, sweaty, or throw up (vomit) blood. MAKE SURE YOU:   Understand these instructions.  Will watch your condition.  Will get help right away if you are not doing well or get worse. Document Released: 06/05/2008 Document Revised: 11/19/2011 Document Reviewed: 06/05/2008 [facility] Patient Information 2015 [employer], [place]. This information is not intended to replace advice given to you by your health care provider. Make sure you discuss any questions you have with your health care provider.

## 2014-12-26 NOTE — ED Provider Notes (Signed)
CSN: [id number]     Arrival date & time 12/26/14  0708 History   First MD Initiated Contact with Patient 12/26/14 [phone]     Chief Complaint  Patient presents with  . Epistaxis     (Consider location/radiation/quality/duration/timing/severity/associated sxs/prior Treatment) HPI Developed right-sided nosebleed 6:30 AM while sitting up. Bleeding stopped spontaneously after arrival here. He complains of mild generalized weakness no other associated symptoms. No treatment prior to coming. Past Medical History  Diagnosis Date  . Kidney stones    No past surgical history on file. No family history on file. History  Substance Use Topics  . Smoking status: Never Smoker   . Smokeless tobacco: Not on file  . Alcohol Use: No    Review of Systems  HENT: Negative.   Respiratory: Negative.   Cardiovascular: Negative.   Gastrointestinal: Negative.   Musculoskeletal: Negative.   Skin: Negative.   Neurological: Positive for weakness.  Psychiatric/Behavioral: Negative.   All other systems reviewed and are negative.     Allergies  Review of patient's allergies indicates no known allergies.  Home Medications   Prior to Admission medications   Medication Sig Start Date End Date Taking? Authorizing Provider  doxycycline (VIBRAMYCIN) 100 MG capsule Take 100 mg by mouth daily.      Historical Provider, MD  pantoprazole (PROTONIX) 20 MG tablet Take 20 mg by mouth daily.      Historical Provider, MD   BP 136/95 mmHg  Pulse 73  Temp(Src) 97.8 F (36.6 C) (Oral)  Resp 18  SpO2 99% Physical Exam  Constitutional: He appears well-developed and well-nourished.  HENT:  Head: Normocephalic and atraumatic.  Right Ear: External ear normal.  Left Ear: External ear normal.  Nose: Nose normal.  Nose inspected with nasal speculum using fiberoptic headlamp. No bleeding site visualized. No active bleeding. No dried blood nares  Eyes: Conjunctivae are normal. Pupils are equal, round, and reactive  to light.  Neck: Neck supple. No tracheal deviation present. No thyromegaly present.  Cardiovascular: Normal rate and regular rhythm.   No murmur heard. Pulmonary/Chest: Effort normal and breath sounds normal.  Abdominal: Soft. Bowel sounds are normal. He exhibits no distension. There is no tenderness.  Musculoskeletal: Normal range of motion. He exhibits no edema or tenderness.  Neurological: He is alert. Coordination normal.  Skin: Skin is warm and dry. No rash noted.  Psychiatric: He has a normal mood and affect.  Nursing note and vitals reviewed.   ED Course  Procedures (including critical care time) Labs Review Labs Reviewed - No data to display  Imaging Review No results found.   EKG Interpretation None     Results for orders placed or performed during the hospital encounter of 12/26/14  CBC with Differential/Platelet  Result Value Ref Range   WBC 6.2 4.0 - 10.5 K/uL   RBC 5.16 4.22 - 5.81 MIL/uL   Hemoglobin 15.1 13.0 - 17.0 g/dL   HCT 84.645.3 96.239.0 - 95.252.0 %   MCV 87.8 78.0 - 100.0 fL   MCH 29.3 26.0 - 34.0 pg   MCHC 33.3 30.0 - 36.0 g/dL   RDW 84.112.5 32.411.5 - 40.115.5 %   Platelets 263 150 - 400 K/uL   Neutrophils Relative % 58 43 - 77 %   Neutro Abs 3.6 1.7 - 7.7 K/uL   Lymphocytes Relative 24 12 - 46 %   Lymphs Abs 1.5 0.7 - 4.0 K/uL   Monocytes Relative 8 3 - 12 %   Monocytes Absolute 0.5 0.1 -  1.0 K/uL   Eosinophils Relative 9 (H) 0 - 5 %   Eosinophils Absolute 0.5 0.0 - 0.7 K/uL   Basophils Relative 1 0 - 1 %   Basophils Absolute 0.1 0.0 - 0.1 K/uL   No results found.  MDM  No bleeding site visualized. No active bleeding. Plan saline nasal spray. Return as needed. Final diagnoses:  None   diagnosis epistaxis    Doug Sou, MD 12/26/14 [phone]

## 2014-12-26 NOTE — ED Provider Notes (Signed)
CSN: [id number]     Arrival date & time 12/26/14  2341 History   First MD Initiated Contact with Patient 12/26/14 2356     Chief Complaint  Patient presents with  . Epistaxis     (Consider location/radiation/quality/duration/timing/severity/associated sxs/prior Treatment) Patient is a 54 y.o. male presenting with nosebleeds.  Epistaxis Location:  R nare Severity:  Moderate Duration:  1 day Timing:  Intermittent Progression:  Unchanged Chronicity:  New Context: not anticoagulants, not bleeding disorder and not hypertension   Relieved by:  Nothing Worsened by:  Nothing tried Associated symptoms: no blood in oropharynx, no facial pain, no fever and no sneezing     Past Medical History  Diagnosis Date  . Kidney stones   . Duodenitis    History reviewed. No pertinent past surgical history. History reviewed. No pertinent family history. History  Substance Use Topics  . Smoking status: Never Smoker   . Smokeless tobacco: Not on file  . Alcohol Use: No    Review of Systems  Constitutional: Negative for fever.  HENT: Positive for nosebleeds. Negative for sneezing.   All other systems reviewed and are negative.     Allergies  Penicillins  Home Medications   Prior to Admission medications   Medication Sig Start Date End Date Taking? Authorizing Provider  doxycycline (VIBRAMYCIN) 100 MG capsule Take 100 mg by mouth daily.      Historical Provider, MD  halobetasol (ULTRAVATE) 0.05 % cream Apply 1 application topically 2 (two) times daily.  12/11/14   Historical Provider, MD  levocetirizine (XYZAL) 5 MG tablet Take 5 mg by mouth every evening.  11/27/14   Historical Provider, MD  minocycline (MINOCIN,DYNACIN) 100 MG capsule Take 100 mg by mouth 2 (two) times daily.  11/26/14   Historical Provider, MD  pantoprazole (PROTONIX) 40 MG tablet Take 40 mg by mouth daily.  10/05/14   Historical Provider, MD   BP 135/92 mmHg  Pulse 75  Temp(Src) 98.4 F (36.9 C) (Oral)  Resp 20   SpO2 99% Physical Exam  Constitutional: He is oriented to person, place, and time. He appears well-developed and well-nourished.  HENT:  Head: Normocephalic and atraumatic.  Nose: No epistaxis.  Eyes: Conjunctivae and EOM are normal.  Neck: Normal range of motion. Neck supple.  Cardiovascular: Normal rate, regular rhythm and normal heart sounds.   Pulmonary/Chest: Effort normal and breath sounds normal. No respiratory distress.  Abdominal: He exhibits no distension. There is no tenderness. There is no rebound and no guarding.  Musculoskeletal: Normal range of motion.  Neurological: He is alert and oriented to person, place, and time.  Skin: Skin is warm and dry.  Vitals reviewed.   ED Course  Procedures (including critical care time) Labs Review Labs Reviewed - No data to display  Imaging Review No results found.   EKG Interpretation None      MDM   Final diagnoses:  Epistaxis, recurrent    54 y.o. male with pertinent PMH of visit same day for epistaxis presents with recurrent epistaxis.  Has had 4 episodes today.  Episodes last <30 min, resolve with pressure.  He has not tried nasal sprays of any sort.  No epistaxis on arrival.  Physical exam benign, no obvious epistaxis.  Will have pt take afrin, see ENT.  Do not feel packing warranted.  Pt not tachycardic or with exam signs of anemia.  DC home in stable condition.  I have reviewed all laboratory and imaging studies if ordered as above  1.  Epistaxis, recurrent         Mirian Mo, MD 12/27/14 [phone]

## 2014-12-26 NOTE — ED Notes (Signed)
Pt shown out of department. Verbalized understanding discharge instructions. In no acute distress.  Registration completed bedside d/c.

## 2014-12-26 NOTE — ED Notes (Signed)
Pt states he has been having nosebleeds off and on all day  Pt was seen here earlier this morning for the same  Pt states he has had 4 since he was discharged  Pt states he thinks it has stopped at the moment

## 2014-12-26 NOTE — ED Notes (Signed)
MD at bedside. 

## 2014-12-27 MED ORDER — OXYMETAZOLINE HCL 0.05 % NA SOLN
2.0000 | Freq: Once | NASAL | Status: AC
  Administered 2014-12-27: 2 via NASAL
  Filled 2014-12-27: qty 15

## 2014-12-27 NOTE — Discharge Instructions (Signed)

## 2017-01-21 DIAGNOSIS — J069 Acute upper respiratory infection, unspecified: Secondary | ICD-10-CM | POA: Diagnosis not present

## 2017-01-22 DIAGNOSIS — R6889 Other general symptoms and signs: Secondary | ICD-10-CM | POA: Diagnosis not present

## 2017-01-22 DIAGNOSIS — J029 Acute pharyngitis, unspecified: Secondary | ICD-10-CM | POA: Diagnosis not present

## 2017-07-15 DIAGNOSIS — J029 Acute pharyngitis, unspecified: Secondary | ICD-10-CM | POA: Diagnosis not present

## 2017-10-15 DIAGNOSIS — E782 Mixed hyperlipidemia: Secondary | ICD-10-CM | POA: Diagnosis not present

## 2017-10-15 DIAGNOSIS — E559 Vitamin D deficiency, unspecified: Secondary | ICD-10-CM | POA: Diagnosis not present

## 2017-10-15 DIAGNOSIS — Z125 Encounter for screening for malignant neoplasm of prostate: Secondary | ICD-10-CM | POA: Diagnosis not present

## 2017-11-01 DIAGNOSIS — K640 First degree hemorrhoids: Secondary | ICD-10-CM | POA: Diagnosis not present

## 2017-11-01 DIAGNOSIS — Z800 Family history of malignant neoplasm of digestive organs: Secondary | ICD-10-CM | POA: Diagnosis not present

## 2017-12-16 DIAGNOSIS — E559 Vitamin D deficiency, unspecified: Secondary | ICD-10-CM | POA: Diagnosis not present

## 2018-02-11 DIAGNOSIS — D229 Melanocytic nevi, unspecified: Secondary | ICD-10-CM | POA: Diagnosis not present

## 2018-02-11 DIAGNOSIS — E782 Mixed hyperlipidemia: Secondary | ICD-10-CM | POA: Diagnosis not present

## 2018-02-11 DIAGNOSIS — K219 Gastro-esophageal reflux disease without esophagitis: Secondary | ICD-10-CM | POA: Diagnosis not present

## 2018-02-11 DIAGNOSIS — E559 Vitamin D deficiency, unspecified: Secondary | ICD-10-CM | POA: Diagnosis not present

## 2018-02-26 DIAGNOSIS — L821 Other seborrheic keratosis: Secondary | ICD-10-CM | POA: Diagnosis not present

## 2018-02-26 DIAGNOSIS — L218 Other seborrheic dermatitis: Secondary | ICD-10-CM | POA: Diagnosis not present

## 2018-11-19 DIAGNOSIS — H04123 Dry eye syndrome of bilateral lacrimal glands: Secondary | ICD-10-CM | POA: Diagnosis not present

## 2018-11-19 DIAGNOSIS — H17821 Peripheral opacity of cornea, right eye: Secondary | ICD-10-CM | POA: Diagnosis not present

## 2019-03-06 DIAGNOSIS — L218 Other seborrheic dermatitis: Secondary | ICD-10-CM | POA: Diagnosis not present

## 2019-03-24 DIAGNOSIS — S30865A Insect bite (nonvenomous) of unspecified external genital organs, male, initial encounter: Secondary | ICD-10-CM | POA: Diagnosis not present

## 2019-03-31 ENCOUNTER — Other Ambulatory Visit: Payer: Self-pay

## 2019-03-31 ENCOUNTER — Emergency Department (HOSPITAL_COMMUNITY)
Admission: EM | Admit: 2019-03-31 | Discharge: 2019-03-31 | Disposition: A | Discharge status: Home / Self Care | Payer: BC Managed Care – PPO | Attending: Emergency Medicine | Admitting: Emergency Medicine

## 2019-03-31 ENCOUNTER — Encounter (HOSPITAL_COMMUNITY): Payer: Self-pay

## 2019-03-31 DIAGNOSIS — Z87442 Personal history of urinary calculi: Secondary | ICD-10-CM | POA: Diagnosis not present

## 2019-03-31 DIAGNOSIS — M545 Low back pain: Secondary | ICD-10-CM | POA: Diagnosis not present

## 2019-03-31 DIAGNOSIS — Z79899 Other long term (current) drug therapy: Secondary | ICD-10-CM | POA: Diagnosis not present

## 2019-03-31 DIAGNOSIS — R109 Unspecified abdominal pain: Secondary | ICD-10-CM | POA: Insufficient documentation

## 2019-03-31 DIAGNOSIS — R319 Hematuria, unspecified: Secondary | ICD-10-CM | POA: Diagnosis not present

## 2019-03-31 DIAGNOSIS — Z880 Allergy status to penicillin: Secondary | ICD-10-CM | POA: Insufficient documentation

## 2019-03-31 DIAGNOSIS — R112 Nausea with vomiting, unspecified: Secondary | ICD-10-CM | POA: Diagnosis not present

## 2019-03-31 LAB — CBC WITH DIFFERENTIAL/PLATELET
Abs Immature Granulocytes: 0.05 10*3/uL (ref 0.00–0.07)
Basophils Absolute: 0.1 10*3/uL (ref 0.0–0.1)
Basophils Relative: 0 %
Eosinophils Absolute: 0 10*3/uL (ref 0.0–0.5)
Eosinophils Relative: 0 %
HCT: 47.4 % (ref 39.0–52.0)
Hemoglobin: 14.9 g/dL (ref 13.0–17.0)
Immature Granulocytes: 0 %
Lymphocytes Relative: 6 %
Lymphs Abs: 1 10*3/uL (ref 0.7–4.0)
MCH: 25.9 pg — ABNORMAL LOW (ref 26.0–34.0)
MCHC: 31.4 g/dL (ref 30.0–36.0)
MCV: 82.4 fL (ref 80.0–100.0)
Monocytes Absolute: 0.8 10*3/uL (ref 0.1–1.0)
Monocytes Relative: 5 %
Neutro Abs: 13.3 10*3/uL — ABNORMAL HIGH (ref 1.7–7.7)
Neutrophils Relative %: 89 %
Platelets: 383 10*3/uL (ref 150–400)
RBC: 5.75 MIL/uL (ref 4.22–5.81)
RDW: 15.4 % (ref 11.5–15.5)
WBC: 15.2 10*3/uL — ABNORMAL HIGH (ref 4.0–10.5)
nRBC: 0 % (ref 0.0–0.2)

## 2019-03-31 LAB — LIPASE, BLOOD: Lipase: 39 U/L (ref 11–51)

## 2019-03-31 LAB — COMPREHENSIVE METABOLIC PANEL
ALT: 20 U/L (ref 0–44)
AST: 22 U/L (ref 15–41)
Albumin: 4.3 g/dL (ref 3.5–5.0)
Alkaline Phosphatase: 78 U/L (ref 38–126)
Anion gap: 12 (ref 5–15)
BUN: 14 mg/dL (ref 6–20)
CO2: 21 mmol/L — ABNORMAL LOW (ref 22–32)
Calcium: 9.8 mg/dL (ref 8.9–10.3)
Chloride: 107 mmol/L (ref 98–111)
Creatinine, Ser: 1.3 mg/dL — ABNORMAL HIGH (ref 0.61–1.24)
GFR calc Af Amer: >60 mL/min (ref >60)
GFR calc non Af Amer: >60 mL/min (ref >60)
Glucose, Bld: 125 mg/dL — ABNORMAL HIGH (ref 70–99)
Potassium: 3.7 mmol/L (ref 3.5–5.1)
Sodium: 140 mmol/L (ref 135–145)
Total Bilirubin: 0.7 mg/dL (ref 0.3–1.2)
Total Protein: 7.5 g/dL (ref 6.5–8.1)

## 2019-03-31 LAB — URINALYSIS, ROUTINE W REFLEX MICROSCOPIC
Bacteria, UA: NONE SEEN
Bilirubin Urine: NEGATIVE
Glucose, UA: NEGATIVE mg/dL
Ketones, ur: 5 mg/dL — AB
Leukocytes,Ua: NEGATIVE
Nitrite: NEGATIVE
Protein, ur: NEGATIVE mg/dL
Specific Gravity, Urine: 1.014 (ref 1.005–1.030)
pH: 5 (ref 5.0–8.0)

## 2019-03-31 MED ORDER — TAMSULOSIN HCL 0.4 MG PO CAPS: 0.4000 mg | ORAL_CAPSULE | Freq: Every day | ORAL | 0 refills | Status: DC

## 2019-03-31 MED ORDER — ONDANSETRON 4 MG PO TBDP
4.0000 mg | ORAL_TABLET | Freq: Once | ORAL | Status: AC
  Administered 2019-03-31: 4 mg via ORAL
  Filled 2019-03-31: qty 1

## 2019-03-31 MED ORDER — ONDANSETRON 4 MG PO TBDP: 4.0000 mg | ORAL_TABLET | Freq: Three times a day (TID) | ORAL | 0 refills | Status: DC | PRN

## 2019-03-31 MED ORDER — HYDROCODONE-ACETAMINOPHEN 5-325 MG PO TABS: 1.0000 | ORAL_TABLET | Freq: Four times a day (QID) | ORAL | 0 refills | Status: DC | PRN

## 2019-03-31 MED ORDER — HYDROCODONE-ACETAMINOPHEN 5-325 MG PO TABS
2.0000 | ORAL_TABLET | Freq: Once | ORAL | Status: AC
Start: 2019-03-31 — End: 2019-03-31
  Administered 2019-03-31: 2 via ORAL
  Filled 2019-03-31: qty 2

## 2019-03-31 NOTE — ED Notes (Signed)
Strainer given to patient for stone.

## 2019-03-31 NOTE — Discharge Instructions (Addendum)
You have been diagnosed today with Left Flank Pain  At this time there does not appear to be the presence of an emergent medical condition, however there is always the potential for conditions to change. Please read and follow the below instructions.  Please return to the Emergency Department immediately for any new or worsening symptoms or if your symptoms do not improve within 2 days. Please be sure to follow up with your Primary Care Provider within one week regarding your visit today; please call their office to schedule an appointment even if you are feeling better for a follow-up visit. You may take the pain medication Norco as prescribed for severe pain.  Do not drive or operate heavy machinery while taking Norco as will make you drowsy.  Do not drink alcohol or take other sedating medications with Norco as this will worsen side effects. You may use the medication Flomax as prescribed to help facilitate stone passage. You may use the nausea medication Zofran as prescribed for nausea and vomiting. As we discussed no imaging was performed to confirm presence of a kidney stone today, there are other possibilities for pain of your left flank, if your pain feels any different than your typical kidney stone pain or if your symptoms do not improve within the next day please return to the emergency department for further evaluation and treatment.  If you change your mind and wish to have further evaluation for possible kidney stone or other causes of your pain you may return to the emergency department at any time.  Get help right away if: You have trouble breathing. You are short of breath. Your belly hurts, or it is swollen or red. You feel sick to your stomach (nauseous). You throw up (vomit). You feel like you will pass out, or you do pass out (faint). You have blood in your pee. You have fever or chills You have a new/concerning or worsening symptoms  Please read the additional information  packets attached to your discharge summary.  Do not take your medicine if  develop an itchy rash, swelling in your mouth or lips, or difficulty breathing; call 911 and seek immediate emergency medical attention if this occurs.

## 2019-03-31 NOTE — ED Triage Notes (Signed)
Pt complains of left flank pain, hx of kidney stones and the pain is the same, he states this time he has lots of vomiting

## 2019-03-31 NOTE — ED Provider Notes (Signed)
[facility] DEPT Provider Note   CSN: [id number] Arrival date & time: 03/31/19  1958    History   Chief Complaint Chief Complaint  Patient presents with  . Flank Pain    HPI Edward Gordon is a 58 y.o. male presented today for left flank pain that began this morning around 8:30 AM.  Patient reports a throbbing sensation intermittent in the left flank that is occasionally severe but resolves completely in between exacerbations, denies radiation of his pain.  Patient reports history of 5 kidney stones in the past that feel similar to this.  Patient reports that he has had multiple episodes of nausea and nonbloody/nonbilious emesis today but he has been able to eat and drink since that time without difficulty.  He denies fever, chills, fall/injury, abdominal pain, chest pain/shortness of breath, dysuria/hematuria, diarrhea, blood in the stool, hematemesis, cough, testicular pain/swelling or any additional concerns.    HPI  Past Medical History:  Diagnosis Date  . Duodenitis   . Kidney stones     Patient Active Problem List   Diagnosis Date Noted  . Duodenitis     History reviewed. No pertinent surgical history.      Home Medications    Prior to Admission medications   Medication Sig Start Date End Date Taking? Authorizing Provider  famotidine (PEPCID) 20 MG tablet Take 20 mg by mouth daily.   Yes [provider]  HYDROcodone-acetaminophen (NORCO/VICODIN) 5-325 MG tablet Take 1-2 tablets by mouth every 6 (six) hours as needed. 03/31/19   Nuala Alpha A, PA-C  ondansetron (ZOFRAN ODT) 4 MG disintegrating tablet Take 1 tablet (4 mg total) by mouth every 8 (eight) hours as needed for nausea or vomiting. 03/31/19   Nuala Alpha A, PA-C  tamsulosin (FLOMAX) 0.4 MG CAPS capsule Take 1 capsule (0.4 mg total) by mouth daily. 03/31/19   Deliah Boston, PA-C    Family History History reviewed. No pertinent family history.  Social  History Social History   Tobacco Use  . Smoking status: Never Smoker  . Smokeless tobacco: Never Used  Substance Use Topics  . Alcohol use: No  . Drug use: No     Allergies   Penicillins   Review of Systems Review of Systems Ten systems are reviewed and are negative for acute change except as noted in the HPI  Physical Exam Updated Vital Signs BP (!) 145/97   Pulse 77   Temp 98.4 F (36.9 C) (Oral)   Resp 16   SpO2 98%   Physical Exam Constitutional:      General: He is not in acute distress.    Appearance: Normal appearance. He is well-developed. He is not ill-appearing or diaphoretic.  HENT:     Head: Normocephalic and atraumatic.     Right Ear: External ear normal.     Left Ear: External ear normal.     Nose: Nose normal.  Eyes:     General: Vision grossly intact. Gaze aligned appropriately.     Pupils: Pupils are equal, round, and reactive to light.  Neck:     Musculoskeletal: Normal range of motion.     Trachea: Trachea and phonation normal. No tracheal deviation.  Cardiovascular:     Pulses:          Dorsalis pedis pulses are 2+ on the right side and 2+ on the left side.  Pulmonary:     Effort: Pulmonary effort is normal. No respiratory distress.  Abdominal:  General: There is no distension.     Palpations: Abdomen is soft.     Tenderness: There is no abdominal tenderness. There is no right CVA tenderness, left CVA tenderness, guarding or rebound. Negative signs include Murphy's sign and McBurney's sign.  Musculoskeletal: Normal range of motion.     Comments: No midline C/T/L spinal tenderness to palpation, no paraspinal muscle tenderness, no deformity, crepitus, or step-off noted. No sign of injury to the neck or back.  Feet:     Right foot:     Protective Sensation: 3 sites tested. 3 sites sensed.     Left foot:     Protective Sensation: 3 sites tested. 3 sites sensed.  Skin:    General: Skin is warm and dry.  Neurological:     Mental Status:  He is alert.     GCS: GCS eye subscore is 4. GCS verbal subscore is 5. GCS motor subscore is 6.     Comments: Speech is clear and goal oriented, follows commands Major Cranial nerves without deficit, no facial droop Moves extremities without ataxia, coordination intact  Psychiatric:        Behavior: Behavior normal.    ED Treatments / Results  Labs (all labs ordered are listed, but only abnormal results are displayed) Labs Reviewed  CBC WITH DIFFERENTIAL/PLATELET - Abnormal; Notable for the following components:      Result Value   WBC 15.2 (*)    MCH 25.9 (*)    Neutro Abs 13.3 (*)    All other components within normal limits  COMPREHENSIVE METABOLIC PANEL - Abnormal; Notable for the following components:   CO2 21 (*)    Glucose, Bld 125 (*)    Creatinine, Ser 1.30 (*)    All other components within normal limits  URINALYSIS, ROUTINE W REFLEX MICROSCOPIC - Abnormal; Notable for the following components:   Hgb urine dipstick LARGE (*)    Ketones, ur 5 (*)    All other components within normal limits  LIPASE, BLOOD    EKG None  Radiology No results found.  Procedures Procedures (including critical care time)  Medications Ordered in ED Medications  HYDROcodone-acetaminophen (NORCO/VICODIN) 5-325 MG per tablet 2 tablet (2 tablets Oral Given 03/31/19 2226)  ondansetron (ZOFRAN-ODT) disintegrating tablet 4 mg (4 mg Oral Given 03/31/19 2226)     Initial Impression / Assessment and Plan / ED Course  I have reviewed the triage vital signs and the nursing notes.  Pertinent labs & imaging results that were available during my care of the patient were reviewed by me and considered in my medical decision making (see chart for details).    CBC with leukocytosis of 15.2 with left shift CMP with creatinine 1.3 slightly worsened from previous Lipase within normal limit Urinalysis with hemoglobin, ketones, no WBCs, leukocytes or bacteria. - Patient afebrile, not tachycardic,  not hypotensive, well-appearing and in no acute distress.  Suspect leukocytosis secondary to acute phase/pain, no signs of infection on urinalysis today.  Patient does not meet SIRS/sepsis criteria.  I offered patient CT renal stone study multiple times to evaluate for stone along with size and position and he refused each time.  Patient would like to be discharged on symptomatic treatment for his kidney stone as he states this feels similar to prior.  He has been given first dose of oral nausea and pain medication here in the ED and reports complete relief of symptoms states he is feeling well and would like to be discharged at this  time.  I again discussed CT scanning with the patient and he refused, I informed patient that there are other etiologies for left flank pain, nausea and vomiting other than kidney stone and that CT evaluation may be necessary to evaluate for these, he again refused and wished to be discharged at this time. Feel this is reasonable based on patient's history and presentation he is pain-free with reassuring vital signs, I informed him that if his symptoms worsen or do not completely improve within the next day that he should return to the emergency department immediately for further evaluation and he stated understanding.  PMP reviewed  and the patient is without any current narcotic prescriptions. Reexamination of the abdomen reveals soft nontender abdomen without distention or peritoneal signs.  Tolerating p.o. without nausea or vomiting.  At this time there does not appear to be any evidence of an acute emergency medical condition and the patient appears stable for discharge with appropriate outpatient follow up. Diagnosis was discussed with patient who verbalizes understanding of care plan and is agreeable to discharge. I have discussed return precautions with patient who verbalizes understanding of return precautions. Patient encouraged to follow-up with their PCP. All questions  answered.  Patient has been discharged in good condition.  Patient's case discussed with Dr. Adela LankFloyd who agrees with plan to discharge with symptomatic treatment and outpatient follow-up.   Note: Portions of this report may have been transcribed using voice recognition software. Every effort was made to ensure accuracy; however, inadvertent computerized transcription errors may still be present. Final Clinical Impressions(s) / ED Diagnoses   Final diagnoses:  Left flank pain    ED Discharge Orders         Ordered    tamsulosin (FLOMAX) 0.4 MG CAPS capsule  Daily     03/31/19 2306    ondansetron (ZOFRAN ODT) 4 MG disintegrating tablet  Every 8 hours PRN     03/31/19 2306    HYDROcodone-acetaminophen (NORCO/VICODIN) 5-325 MG tablet  Every 6 hours PRN     03/31/19 2306           Bill SalinasMorelli, Johann Gascoigne A, PA-C 04/01/19 0214    Melene PlanFloyd, Dan, DO 04/01/19 1515

## 2019-07-28 DIAGNOSIS — H1013 Acute atopic conjunctivitis, bilateral: Secondary | ICD-10-CM | POA: Diagnosis not present

## 2020-05-30 DIAGNOSIS — L218 Other seborrheic dermatitis: Secondary | ICD-10-CM | POA: Diagnosis not present

## 2020-12-10 DIAGNOSIS — R42 Dizziness and giddiness: Secondary | ICD-10-CM | POA: Diagnosis not present

## 2020-12-10 DIAGNOSIS — Z03818 Encounter for observation for suspected exposure to other biological agents ruled out: Secondary | ICD-10-CM | POA: Diagnosis not present

## 2020-12-10 DIAGNOSIS — R52 Pain, unspecified: Secondary | ICD-10-CM | POA: Diagnosis not present

## 2020-12-11 ENCOUNTER — Other Ambulatory Visit: Payer: Self-pay

## 2020-12-11 ENCOUNTER — Emergency Department (HOSPITAL_BASED_OUTPATIENT_CLINIC_OR_DEPARTMENT_OTHER): Payer: BC Managed Care – PPO

## 2020-12-11 ENCOUNTER — Encounter (HOSPITAL_BASED_OUTPATIENT_CLINIC_OR_DEPARTMENT_OTHER): Payer: Self-pay | Admitting: Emergency Medicine

## 2020-12-11 ENCOUNTER — Emergency Department (HOSPITAL_BASED_OUTPATIENT_CLINIC_OR_DEPARTMENT_OTHER)
Admission: EM | Admit: 2020-12-11 | Discharge: 2020-12-12 | Disposition: A | Discharge status: Home / Self Care | Payer: BC Managed Care – PPO | Attending: Emergency Medicine | Admitting: Emergency Medicine

## 2020-12-11 ENCOUNTER — Emergency Department (HOSPITAL_COMMUNITY): Payer: BC Managed Care – PPO

## 2020-12-11 DIAGNOSIS — Z20822 Contact with and (suspected) exposure to covid-19: Secondary | ICD-10-CM | POA: Insufficient documentation

## 2020-12-11 DIAGNOSIS — J3489 Other specified disorders of nose and nasal sinuses: Secondary | ICD-10-CM | POA: Diagnosis not present

## 2020-12-11 DIAGNOSIS — R29818 Other symptoms and signs involving the nervous system: Secondary | ICD-10-CM | POA: Diagnosis not present

## 2020-12-11 DIAGNOSIS — R1110 Vomiting, unspecified: Secondary | ICD-10-CM | POA: Insufficient documentation

## 2020-12-11 DIAGNOSIS — G9389 Other specified disorders of brain: Secondary | ICD-10-CM | POA: Diagnosis not present

## 2020-12-11 DIAGNOSIS — J329 Chronic sinusitis, unspecified: Secondary | ICD-10-CM | POA: Diagnosis not present

## 2020-12-11 DIAGNOSIS — R42 Dizziness and giddiness: Secondary | ICD-10-CM | POA: Diagnosis not present

## 2020-12-11 LAB — CBC WITH DIFFERENTIAL/PLATELET
Abs Immature Granulocytes: 0.02 10*3/uL (ref 0.00–0.07)
Basophils Absolute: 0.1 10*3/uL (ref 0.0–0.1)
Basophils Relative: 1 %
Eosinophils Absolute: 0.1 10*3/uL (ref 0.0–0.5)
Eosinophils Relative: 1 %
HCT: 47.4 % (ref 39.0–52.0)
Hemoglobin: 15.1 g/dL (ref 13.0–17.0)
Immature Granulocytes: 0 %
Lymphocytes Relative: 12 %
Lymphs Abs: 1 10*3/uL (ref 0.7–4.0)
MCH: 24.6 pg — ABNORMAL LOW (ref 26.0–34.0)
MCHC: 31.9 g/dL (ref 30.0–36.0)
MCV: 77.2 fL — ABNORMAL LOW (ref 80.0–100.0)
Monocytes Absolute: 0.6 10*3/uL (ref 0.1–1.0)
Monocytes Relative: 8 %
Neutro Abs: 6.7 10*3/uL (ref 1.7–7.7)
Neutrophils Relative %: 78 %
Platelets: 415 10*3/uL — ABNORMAL HIGH (ref 150–400)
RBC: 6.14 MIL/uL — ABNORMAL HIGH (ref 4.22–5.81)
RDW: 17.7 % — ABNORMAL HIGH (ref 11.5–15.5)
WBC: 8.4 10*3/uL (ref 4.0–10.5)
nRBC: 0 % (ref 0.0–0.2)

## 2020-12-11 LAB — COMPREHENSIVE METABOLIC PANEL
ALT: 22 U/L (ref 0–44)
AST: 22 U/L (ref 15–41)
Albumin: 4.6 g/dL (ref 3.5–5.0)
Alkaline Phosphatase: 85 U/L (ref 38–126)
Anion gap: 10 (ref 5–15)
BUN: 19 mg/dL (ref 6–20)
CO2: 24 mmol/L (ref 22–32)
Calcium: 10.3 mg/dL (ref 8.9–10.3)
Chloride: 103 mmol/L (ref 98–111)
Creatinine, Ser: 1.35 mg/dL — ABNORMAL HIGH (ref 0.61–1.24)
GFR, Estimated: >60 mL/min (ref >60)
Glucose, Bld: 119 mg/dL — ABNORMAL HIGH (ref 70–99)
Potassium: 4 mmol/L (ref 3.5–5.1)
Sodium: 137 mmol/L (ref 135–145)
Total Bilirubin: 0.6 mg/dL (ref 0.3–1.2)
Total Protein: 8.1 g/dL (ref 6.5–8.1)

## 2020-12-11 LAB — APTT: aPTT: 24 seconds (ref 24–36)

## 2020-12-11 LAB — URINALYSIS, ROUTINE W REFLEX MICROSCOPIC
Bilirubin Urine: NEGATIVE
Glucose, UA: NEGATIVE mg/dL
Hgb urine dipstick: NEGATIVE
Ketones, ur: 15 mg/dL — AB
Leukocytes,Ua: NEGATIVE
Nitrite: NEGATIVE
Protein, ur: NEGATIVE mg/dL
Specific Gravity, Urine: >1.03 — ABNORMAL HIGH (ref 1.005–1.030)
pH: 5.5 (ref 5.0–8.0)

## 2020-12-11 LAB — PROTIME-INR
INR: 0.9 (ref 0.8–1.2)
Prothrombin Time: 11.9 seconds (ref 11.4–15.2)

## 2020-12-11 LAB — TROPONIN I (HIGH SENSITIVITY)
Troponin I (High Sensitivity): 2 ng/L (ref <18)
Troponin I (High Sensitivity): <2 ng/L (ref <18)

## 2020-12-11 LAB — RESP PANEL BY RT-PCR (FLU A&B, COVID) ARPGX2
Influenza A by PCR: NEGATIVE
Influenza B by PCR: NEGATIVE
SARS Coronavirus 2 by RT PCR: NEGATIVE

## 2020-12-11 MED ORDER — LORAZEPAM 2 MG/ML IJ SOLN: 0.5000 mg | Freq: Once | INTRAMUSCULAR | Status: DC

## 2020-12-11 MED ORDER — FAMOTIDINE 20 MG PO TABS
40.0000 mg | ORAL_TABLET | Freq: Once | ORAL | Status: AC
  Administered 2020-12-11: 40 mg via ORAL
  Filled 2020-12-11: qty 2

## 2020-12-11 NOTE — ED Triage Notes (Signed)
Reports sudden onset of dizziness yesterday morning at 0900 when walking.  Went to UC yesterday for the same.  Reports he was tested for covid/flu/rsv and was negative.  Sent home with meclizine.  Reports no relief with that.

## 2020-12-11 NOTE — ED Provider Notes (Signed)
[facility] EMERGENCY DEPARTMENT Provider Note   CSN: [id number] Arrival date & time: 12/11/20  1603     History Chief Complaint  Patient presents with  . Dizziness    Edward Gordon is a 60 y.o. male.  HPI Yesterday at 8:45 AM patient had been walking on a usual morning walk.  He was suddenly dizzy in a way that things seemed surreal and off balance.  He did not note any 1 arm or leg that was weak or numb or tingling.  He was able to continue walking back to his home.  He did have a mild to moderate frontal headache.  He reports he went home and slept for a while but the symptoms were still present.  At that time he went to urgent care.  He reports he did vomit a couple of times.  He denies he had any double vision.  Symptoms were improved however by remaining still and closing his eyes.  He was prescribed meclizine.  He tried this morning his symptoms were persisting and did not get much relief.  After consultation with the provider at urgent care, patient was advised to come to the emergency department for further evaluation.  Patient reports his symptoms are slightly improved at this time compared to at onset.  He still feels like the room is moving but it is less so than previously.  He has no headache.  He denies double vision or any partial loss of vision.  No interim development of weakness numbness or tingling of the extremities.  Patient was well before symptoms onset.  He is at baseline active and healthy without medical problems.  No history of hypertension.  Blood pressures typically run 130s over 80s or less.  Patient does not smoke or use alcohol.    Past Medical History:  Diagnosis Date  . Duodenitis   . Kidney stones     Patient Active Problem List   Diagnosis Date Noted  . Duodenitis     History reviewed. No pertinent surgical history.     No family history on file.  Social History   Tobacco Use  . Smoking status: Never Smoker  . Smokeless tobacco:  Never Used  Substance Use Topics  . Alcohol use: No  . Drug use: No    Home Medications Prior to Admission medications   Medication Sig Start Date End Date Taking? Authorizing Provider  famotidine (PEPCID) 20 MG tablet Take 20 mg by mouth daily.    [provider]  HYDROcodone-acetaminophen (NORCO/VICODIN) 5-325 MG tablet Take 1-2 tablets by mouth every 6 (six) hours as needed. 03/31/19   Harlene Salts A, PA-C  ondansetron (ZOFRAN ODT) 4 MG disintegrating tablet Take 1 tablet (4 mg total) by mouth every 8 (eight) hours as needed for nausea or vomiting. 03/31/19   Harlene Salts A, PA-C  tamsulosin (FLOMAX) 0.4 MG CAPS capsule Take 1 capsule (0.4 mg total) by mouth daily. 03/31/19   Bill Salinas, PA-C    Allergies    Penicillins  Review of Systems   Review of Systems 10 systems reviewed and negative except as per HPI Physical Exam Updated Vital Signs BP (!) 130/95   Pulse 78   Temp 97.8 F (36.6 C) (Oral)   Resp 16   Ht 6\' 2"  (1.88 m)   Wt 91.8 kg   SpO2 97%   BMI 25.99 kg/m   Physical Exam Constitutional:      Comments: Alert and nontoxic.  Patient is keeping  eyes slightly closed due to vertigo.  No respiratory distress.  HENT:     Head: Normocephalic and atraumatic.     Right Ear: Tympanic membrane normal.     Left Ear: Tympanic membrane normal.     Nose: Nose normal.     Mouth/Throat:     Mouth: Mucous membranes are moist.     Pharynx: Oropharynx is clear.  Eyes:     Comments: Pupils are symmetric and responsive to light.  Patient has nystagmus in all directions.  There appears to be slight disconjugate leg of the right eye in extraocular motions.  Cardiovascular:     Rate and Rhythm: Normal rate and regular rhythm.  Pulmonary:     Effort: Pulmonary effort is normal.     Breath sounds: Normal breath sounds.  Abdominal:     General: There is no distension.     Palpations: Abdomen is soft.     Tenderness: There is no abdominal tenderness. There  is no guarding.  Musculoskeletal:        General: No swelling or tenderness.     Cervical back: Neck supple.     Right lower leg: No edema.     Left lower leg: No edema.  Skin:    General: Skin is warm and dry.  Neurological:     Comments: Patient is alert.  GCS 15.  Speech is clear.  No aphasia.  Motor strength 5\5 upper and lower extremities.  Sensation intact to light touch.  Patient does have nystagmus in all directions.  As stated, there appears to be slight disconjugate eye motion with the right eye legging the left slightly and extraocular motions.  Patient accurately perform finger-nose exam although on the left twice seem to slightly missed the target.  He was able to correct this.  Heel shin exam intact and normal  Psychiatric:        Mood and Affect: Mood normal.     ED Results / Procedures / Treatments   Labs (all labs ordered are listed, but only abnormal results are displayed) Labs Reviewed  COMPREHENSIVE METABOLIC PANEL - Abnormal; Notable for the following components:      Result Value   Glucose, Bld 119 (*)    Creatinine, Ser 1.35 (*)    All other components within normal limits  CBC WITH DIFFERENTIAL/PLATELET - Abnormal; Notable for the following components:   RBC 6.14 (*)    MCV 77.2 (*)    MCH 24.6 (*)    RDW 17.7 (*)    Platelets 415 (*)    All other components within normal limits  URINALYSIS, ROUTINE W REFLEX MICROSCOPIC - Abnormal; Notable for the following components:   Specific Gravity, Urine >1.030 (*)    Ketones, ur 15 (*)    All other components within normal limits  RESP PANEL BY RT-PCR (FLU A&B, COVID) ARPGX2  PROTIME-INR  APTT  TROPONIN I (HIGH SENSITIVITY)  TROPONIN I (HIGH SENSITIVITY)    EKG EKG Interpretation  Date/Time:  Sunday December 11 2020 16:13:09 EDT Ventricular Rate:  73 PR Interval:  165 QRS Duration: 95 QT Interval:  406 QTC Calculation: 448 R Axis:   12 Text Interpretation: Sinus rhythm normal, no change from previous  Confirmed by Arby Barrette [phone]) on 12/11/2020 6:38:13 PM   Radiology CT Head Wo Contrast  Result Date: 12/11/2020 CLINICAL DATA:  60 year old male with neurologic deficit. EXAM: CT HEAD WITHOUT CONTRAST TECHNIQUE: Contiguous axial images were obtained from the base of the skull through the  vertex without intravenous contrast. COMPARISON:  None. FINDINGS: Brain: The ventricles and sulci are appropriate size for patient's age. The gray-white matter discrimination is preserved. There is no acute intracranial hemorrhage. No mass effect midline shift no extra-axial fluid collection. A 1 cm calcific density along the anterior falx may represent a calcified meningioma. Vascular: No hyperdense vessel or unexpected calcification. Skull: Normal. Negative for fracture or focal lesion. Sinuses/Orbits: There is diffuse mucoperiosteal thickening of paranasal sinuses with opacification of the majority of the left maxillary sinus. No air-fluid level. The mastoid air cells are clear. Other: None IMPRESSION: 1. No acute intracranial pathology. 2. Paranasal sinus disease. Electronically Signed   By: Elgie Collard M.D.   On: 12/11/2020 17:37    Procedures Procedures   Medications Ordered in ED Medications - No data to display  ED Course  I have reviewed the triage vital signs and the nursing notes.  Pertinent labs & imaging results that were available during my care of the patient were reviewed by me and considered in my medical decision making (see chart for details).    MDM Rules/Calculators/A&P                          Consult: Reviewed with neuro hospitalist telemetry neurologist Dr. Selina Cooley at this time plan will be to transfer to [facility] emergency department for MRI for vertigo with appearance of ocular gaze deviation  Consult: Reviewed with Dr. Dalene Seltzer at [facility] emergency department for transfer ED to ED.  Patient presents as outlined with an acute onset of vertiginous symptoms at 8:45 AM  yesterday.  Symptoms were persistent and were still present this morning.  At onset, patient reports a mild to moderate headache that is subsequently resolved.  Blood pressures are mildly elevated from his baseline but not significantly hypertensive.  No hemorrhage identified on CT head.  At this time, stable to proceed to [facility] emergency department for MRI to rule out cerebellar stroke.  Final Clinical Impression(s) / ED Diagnoses Final diagnoses:  Vertigo    Rx / DC Orders ED Discharge Orders    None       Arby Barrette, MD 12/11/20 1840

## 2020-12-11 NOTE — ED Notes (Addendum)
Here for c/o "vertigo" per pt statement, denies any HA, vision issues, or light sensitivity at this time. Stated that now is the best he has felt in the past 24 hours.

## 2020-12-11 NOTE — ED Notes (Signed)
Patient transported to MRI 

## 2020-12-11 NOTE — ED Notes (Signed)
Attempted to call [facility] ED charge nurse at [phone]; call was not answered; VM not set up

## 2020-12-11 NOTE — ED Notes (Signed)
ED Provider at bedside. 

## 2020-12-11 NOTE — ED Provider Notes (Signed)
  Physical Exam  BP (!) 133/95   Pulse 71   Temp 97.8 F (36.6 C) (Oral)   Resp 18   Ht 6\' 2"  (1.88 m)   Wt 91.8 kg   SpO2 99%   BMI 25.99 kg/m   Physical Exam  ED Course/Procedures     Procedures  MDM  Received care of pt from Dr. from [facility].  Transferred for MR brain with concern for dizziness. Feels symptoms improving at time of my evaluation. Continues to await MRI at time of transfer of care.        [facility], MD 12/12/20 [phone]

## 2020-12-12 MED ORDER — MECLIZINE HCL 25 MG PO TABS: 25.0000 mg | ORAL_TABLET | Freq: Three times a day (TID) | ORAL | 0 refills | Status: AC | PRN

## 2020-12-12 MED ORDER — MECLIZINE HCL 25 MG PO TABS
25.0000 mg | ORAL_TABLET | Freq: Once | ORAL | Status: AC
  Administered 2020-12-12: 25 mg via ORAL
  Filled 2020-12-12: qty 1

## 2020-12-12 NOTE — ED Provider Notes (Signed)
MRI negative.  Patient feels improved.  Nystagmus is improving.  Patient is able to ambulate without ataxia.  He has no other acute complaints.  Plan for discharge home   Zadie Rhine, MD 12/12/20 [phone]

## 2020-12-12 NOTE — Discharge Instructions (Addendum)

## 2021-02-07 DIAGNOSIS — U071 COVID-19: Secondary | ICD-10-CM | POA: Diagnosis not present

## 2021-03-28 DIAGNOSIS — E782 Mixed hyperlipidemia: Secondary | ICD-10-CM | POA: Diagnosis not present

## 2021-03-28 DIAGNOSIS — Z0000 Encounter for general adult medical examination without abnormal findings: Secondary | ICD-10-CM | POA: Diagnosis not present

## 2021-03-28 DIAGNOSIS — E559 Vitamin D deficiency, unspecified: Secondary | ICD-10-CM | POA: Diagnosis not present

## 2021-03-28 DIAGNOSIS — Z125 Encounter for screening for malignant neoplasm of prostate: Secondary | ICD-10-CM | POA: Diagnosis not present

## 2021-03-28 DIAGNOSIS — Z23 Encounter for immunization: Secondary | ICD-10-CM | POA: Diagnosis not present

## 2021-07-03 DIAGNOSIS — E559 Vitamin D deficiency, unspecified: Secondary | ICD-10-CM | POA: Diagnosis not present

## 2021-07-03 DIAGNOSIS — Z23 Encounter for immunization: Secondary | ICD-10-CM | POA: Diagnosis not present

## 2021-10-06 DIAGNOSIS — Z23 Encounter for immunization: Secondary | ICD-10-CM | POA: Diagnosis not present

## 2021-11-28 DIAGNOSIS — L218 Other seborrheic dermatitis: Secondary | ICD-10-CM | POA: Diagnosis not present

## 2021-12-18 DIAGNOSIS — J019 Acute sinusitis, unspecified: Secondary | ICD-10-CM | POA: Diagnosis not present

## 2022-01-12 DIAGNOSIS — H2513 Age-related nuclear cataract, bilateral: Secondary | ICD-10-CM | POA: Diagnosis not present

## 2022-01-12 DIAGNOSIS — H17821 Peripheral opacity of cornea, right eye: Secondary | ICD-10-CM | POA: Diagnosis not present

## 2022-01-12 DIAGNOSIS — H04123 Dry eye syndrome of bilateral lacrimal glands: Secondary | ICD-10-CM | POA: Diagnosis not present

## 2022-01-12 DIAGNOSIS — H524 Presbyopia: Secondary | ICD-10-CM | POA: Diagnosis not present

## 2022-01-13 IMAGING — MR MR HEAD W/O CM
6 of 10 series · 24 of 48 positions shown · non-contrast
Comparison: Prior CT from earlier the same day.

CLINICAL DATA: Initial evaluation for acute vertigo.

EXAM:
MRI HEAD WITHOUT CONTRAST
TECHNIQUE: Multiplanar, multiecho pulse sequences of the brain and surrounding
structures were obtained without intravenous contrast.

[Series 2: DWI · axial · 3.0mm · 0.94mm/px · z∈[-69,+86]mm · 9 of 106 slices shown (1 of 2)]
[im 1/106]
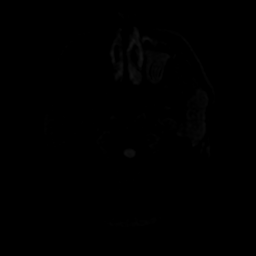
[im 14/106]
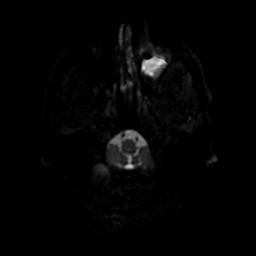
[im 27/106]
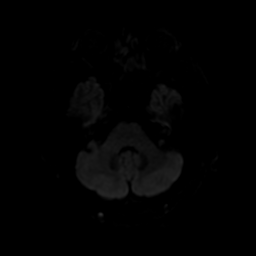
[im 40/106]
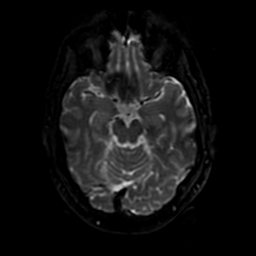
[im 53/106]
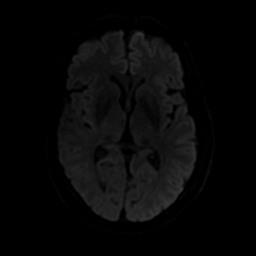
[im 66/106]
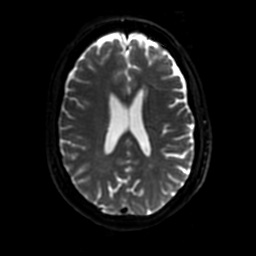
[im 79/106]
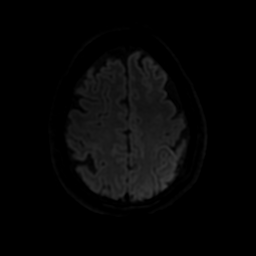
[im 92/106]
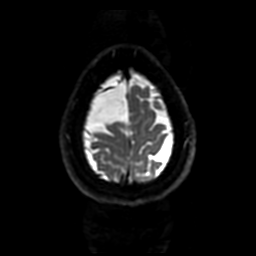
[im 106/106]
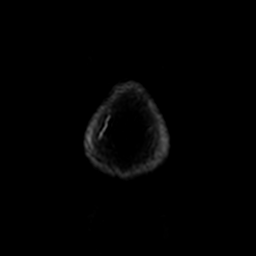

[Series 3: DWI · coronal · 4.0mm · 0.94mm/px · 6 of 78 slices shown (2 of 2)]
[im 1/78]
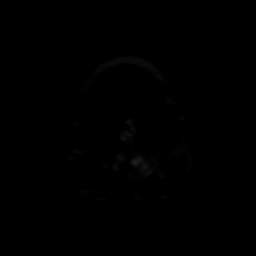
[im 16/78]
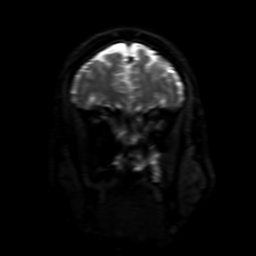
[im 31/78]
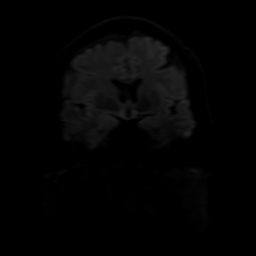
[im 47/78]
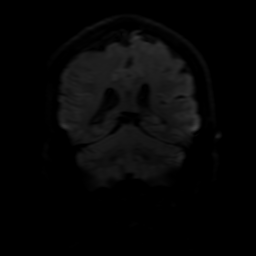
[im 62/78]
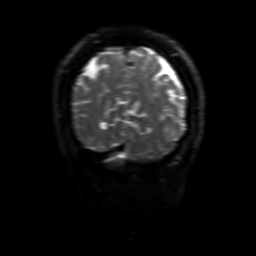
[im 78/78]
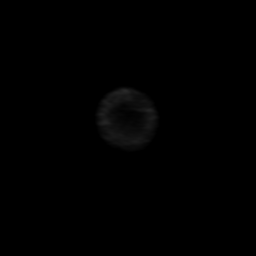

[Series 4: FLAIR · sagittal · 5.0mm · 0.23mm/px · 2 of 25 slices shown (1 of 2)]
[im 1/25]
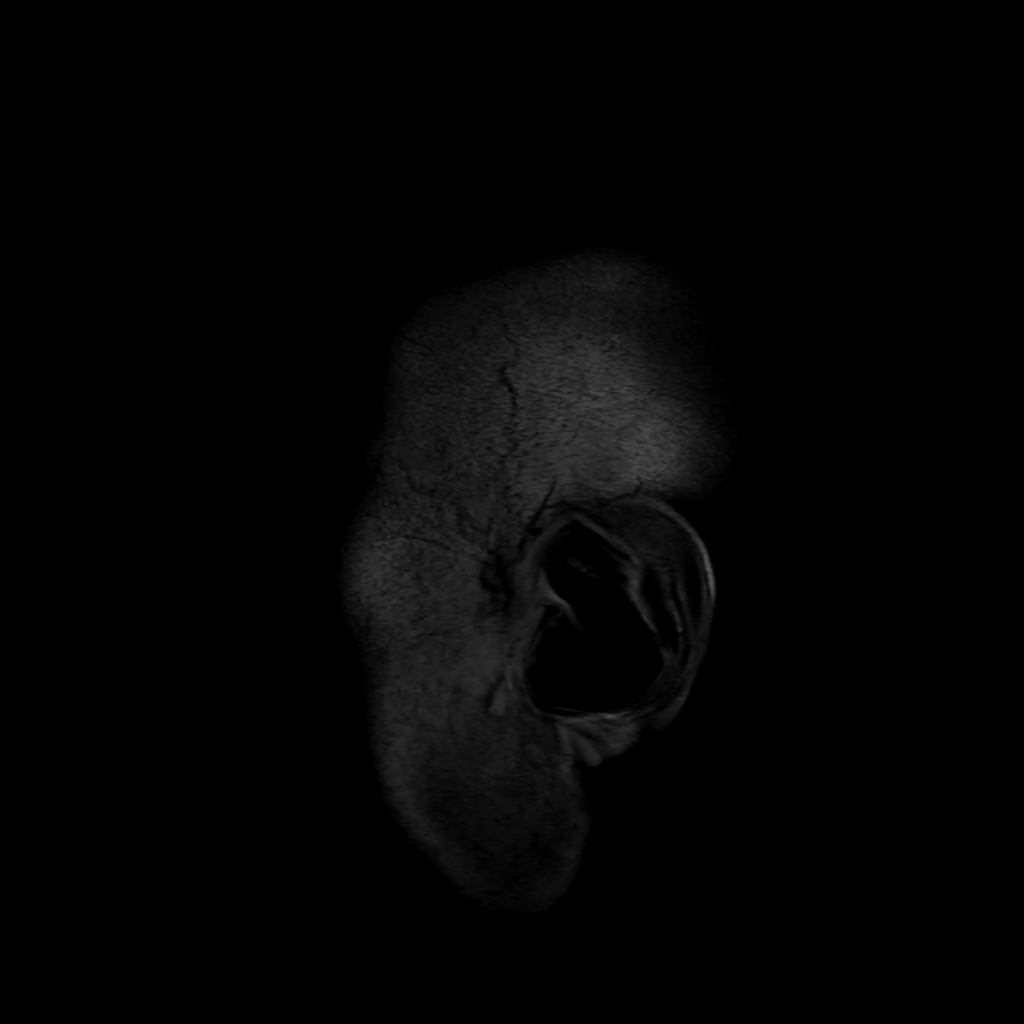
[im 25/25]
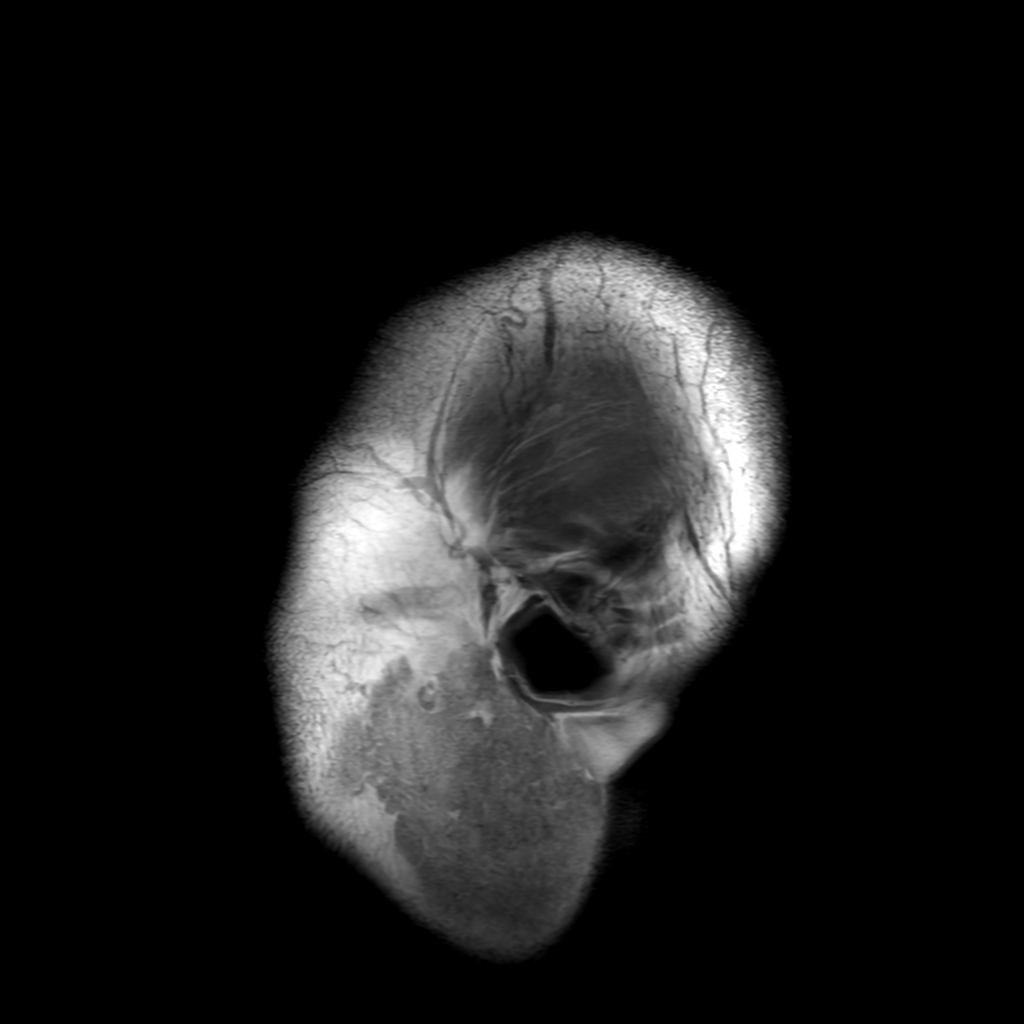

[Series 5: T2 · axial · 5.0mm · 0.23mm/px · 1 of 27 slices shown]
[im 1/27]
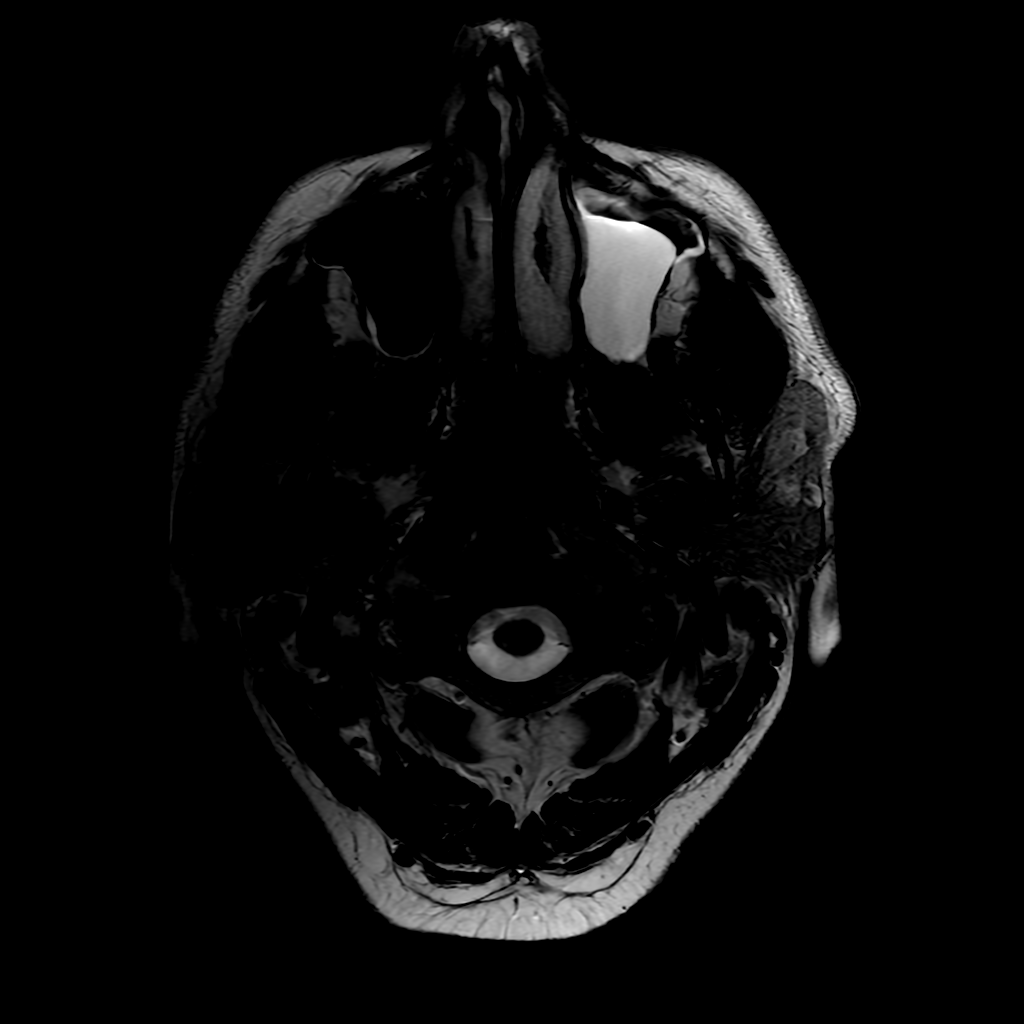

[Series 6: FLAIR · axial · 3.0mm · 0.47mm/px · z∈[-72,+82]mm · 2 of 27 slices shown (2 of 2)]
[im 1/27]
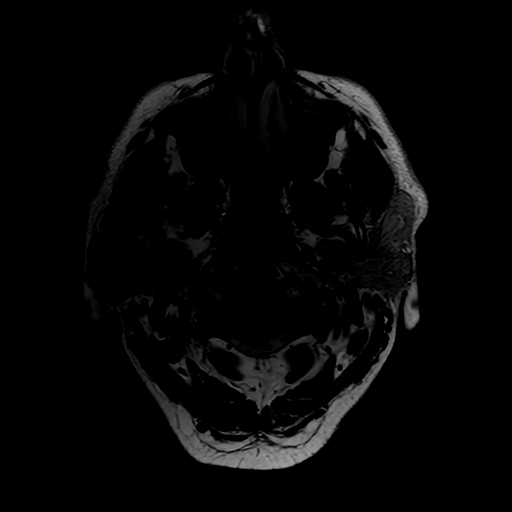
[im 27/27]
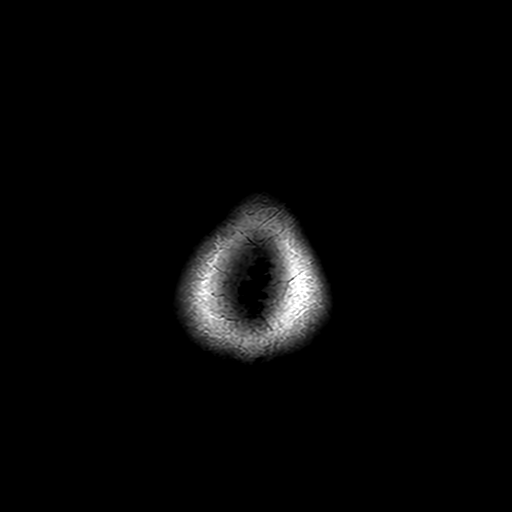

[Series 250: ADC · axial · 3.0mm · 0.94mm/px · z∈[-69,+86]mm · 4 of 53 slices shown]
[im 1/53]
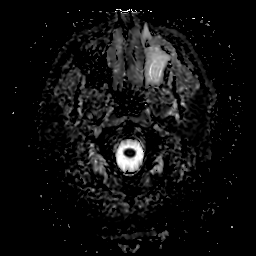
[im 18/53]
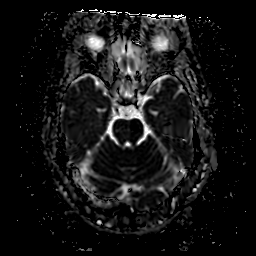
[im 35/53]
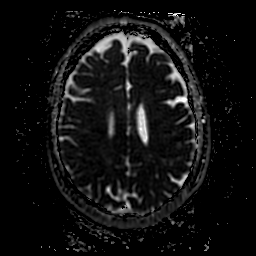
[im 53/53]
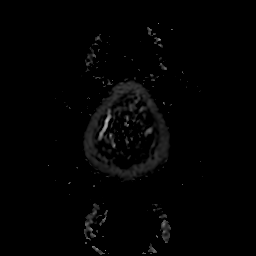

[24 of 48 positions shown; findings below may reference images not displayed]

FINDINGS: Brain: Mild age-related cerebral volume loss. Few scattered
subcentimeter foci of T2/FLAIR hyperintensity noted within the
periventricular, deep, and subcortical white matter both cerebral
hemispheres, nonspecific, but most commonly related to chronic
microvascular ischemic disease. Overall, changes felt to be within
normal limits for age.

No abnormal foci of restricted diffusion to suggest acute or
subacute ischemia. Gray-white matter differentiation maintained. No
encephalomalacia to suggest chronic cortical infarction. No evidence
for acute or chronic intracranial hemorrhage.

No mass lesion, midline shift or mass effect. No hydrocephalus or
extra-axial fluid collection. Pituitary gland suprasellar region
within normal limits. Midline structures intact.

Vascular: Major intracranial vascular flow voids are maintained.

Skull and upper cervical spine: Craniocervical junction within
normal limits. Bone marrow signal intensity normal. No scalp soft
tissue abnormality.

Sinuses/Orbits: Globes and orbital soft tissues demonstrate no acute
finding. Scattered mucosal thickening noted throughout the paranasal
sinuses. Superimposed left maxillary sinus retention cyst. No
significant mastoid effusion. Inner ear structures grossly normal.

Other: None.
IMPRESSION: Negative brain MRI.  No acute intracranial abnormality identified.

## 2022-04-09 DIAGNOSIS — N529 Male erectile dysfunction, unspecified: Secondary | ICD-10-CM | POA: Diagnosis not present

## 2022-04-09 DIAGNOSIS — Z0000 Encounter for general adult medical examination without abnormal findings: Secondary | ICD-10-CM | POA: Diagnosis not present

## 2022-04-09 DIAGNOSIS — Z125 Encounter for screening for malignant neoplasm of prostate: Secondary | ICD-10-CM | POA: Diagnosis not present

## 2022-04-09 DIAGNOSIS — E559 Vitamin D deficiency, unspecified: Secondary | ICD-10-CM | POA: Diagnosis not present

## 2022-04-09 DIAGNOSIS — G4700 Insomnia, unspecified: Secondary | ICD-10-CM | POA: Diagnosis not present

## 2022-04-09 DIAGNOSIS — E782 Mixed hyperlipidemia: Secondary | ICD-10-CM | POA: Diagnosis not present

## 2022-09-12 DIAGNOSIS — Z1283 Encounter for screening for malignant neoplasm of skin: Secondary | ICD-10-CM | POA: Diagnosis not present

## 2022-09-12 DIAGNOSIS — D225 Melanocytic nevi of trunk: Secondary | ICD-10-CM | POA: Diagnosis not present

## 2022-10-12 DIAGNOSIS — U071 COVID-19: Secondary | ICD-10-CM | POA: Diagnosis not present

## 2022-10-12 DIAGNOSIS — R5383 Other fatigue: Secondary | ICD-10-CM | POA: Diagnosis not present

## 2022-10-12 DIAGNOSIS — R0981 Nasal congestion: Secondary | ICD-10-CM | POA: Diagnosis not present

## 2023-01-11 DIAGNOSIS — R051 Acute cough: Secondary | ICD-10-CM | POA: Diagnosis not present

## 2023-01-11 DIAGNOSIS — J029 Acute pharyngitis, unspecified: Secondary | ICD-10-CM | POA: Diagnosis not present

## 2023-01-11 DIAGNOSIS — R5383 Other fatigue: Secondary | ICD-10-CM | POA: Diagnosis not present

## 2023-01-11 DIAGNOSIS — Z03818 Encounter for observation for suspected exposure to other biological agents ruled out: Secondary | ICD-10-CM | POA: Diagnosis not present

## 2023-01-16 DIAGNOSIS — H52223 Regular astigmatism, bilateral: Secondary | ICD-10-CM | POA: Diagnosis not present

## 2023-01-16 DIAGNOSIS — H524 Presbyopia: Secondary | ICD-10-CM | POA: Diagnosis not present

## 2023-01-16 DIAGNOSIS — H2513 Age-related nuclear cataract, bilateral: Secondary | ICD-10-CM | POA: Diagnosis not present

## 2023-01-16 DIAGNOSIS — H17821 Peripheral opacity of cornea, right eye: Secondary | ICD-10-CM | POA: Diagnosis not present

## 2023-01-16 DIAGNOSIS — H04123 Dry eye syndrome of bilateral lacrimal glands: Secondary | ICD-10-CM | POA: Diagnosis not present

## 2023-03-29 DIAGNOSIS — Z800 Family history of malignant neoplasm of digestive organs: Secondary | ICD-10-CM | POA: Diagnosis not present

## 2023-03-29 DIAGNOSIS — K635 Polyp of colon: Secondary | ICD-10-CM | POA: Diagnosis not present

## 2023-03-29 DIAGNOSIS — D123 Benign neoplasm of transverse colon: Secondary | ICD-10-CM | POA: Diagnosis not present

## 2023-03-29 DIAGNOSIS — D125 Benign neoplasm of sigmoid colon: Secondary | ICD-10-CM | POA: Diagnosis not present

## 2023-03-29 DIAGNOSIS — Z1211 Encounter for screening for malignant neoplasm of colon: Secondary | ICD-10-CM | POA: Diagnosis not present

## 2023-08-13 DIAGNOSIS — M722 Plantar fascial fibromatosis: Secondary | ICD-10-CM | POA: Diagnosis not present

## 2023-09-05 DIAGNOSIS — Z0000 Encounter for general adult medical examination without abnormal findings: Secondary | ICD-10-CM | POA: Diagnosis not present

## 2023-09-05 DIAGNOSIS — Z125 Encounter for screening for malignant neoplasm of prostate: Secondary | ICD-10-CM | POA: Diagnosis not present

## 2023-09-05 DIAGNOSIS — E782 Mixed hyperlipidemia: Secondary | ICD-10-CM | POA: Diagnosis not present

## 2023-09-05 DIAGNOSIS — Z23 Encounter for immunization: Secondary | ICD-10-CM | POA: Diagnosis not present

## 2023-09-05 DIAGNOSIS — E559 Vitamin D deficiency, unspecified: Secondary | ICD-10-CM | POA: Diagnosis not present

## 2024-02-13 DIAGNOSIS — H1045 Other chronic allergic conjunctivitis: Secondary | ICD-10-CM | POA: Diagnosis not present

## 2024-02-13 DIAGNOSIS — H00024 Hordeolum internum left upper eyelid: Secondary | ICD-10-CM | POA: Diagnosis not present

## 2024-02-20 DIAGNOSIS — H2513 Age-related nuclear cataract, bilateral: Secondary | ICD-10-CM | POA: Diagnosis not present

## 2024-02-20 DIAGNOSIS — H04123 Dry eye syndrome of bilateral lacrimal glands: Secondary | ICD-10-CM | POA: Diagnosis not present

## 2024-02-20 DIAGNOSIS — H00024 Hordeolum internum left upper eyelid: Secondary | ICD-10-CM | POA: Diagnosis not present

## 2024-02-20 DIAGNOSIS — H524 Presbyopia: Secondary | ICD-10-CM | POA: Diagnosis not present

## 2024-02-20 DIAGNOSIS — H52223 Regular astigmatism, bilateral: Secondary | ICD-10-CM | POA: Diagnosis not present

## 2024-02-20 DIAGNOSIS — H17821 Peripheral opacity of cornea, right eye: Secondary | ICD-10-CM | POA: Diagnosis not present

## 2024-04-11 DIAGNOSIS — R5383 Other fatigue: Secondary | ICD-10-CM | POA: Diagnosis not present

## 2024-04-11 DIAGNOSIS — F439 Reaction to severe stress, unspecified: Secondary | ICD-10-CM | POA: Diagnosis not present

## 2024-04-16 DIAGNOSIS — R5381 Other malaise: Secondary | ICD-10-CM | POA: Diagnosis not present

## 2024-04-16 DIAGNOSIS — R5383 Other fatigue: Secondary | ICD-10-CM | POA: Diagnosis not present

## 2024-04-17 DIAGNOSIS — E559 Vitamin D deficiency, unspecified: Secondary | ICD-10-CM | POA: Diagnosis not present

## 2024-04-17 DIAGNOSIS — R5383 Other fatigue: Secondary | ICD-10-CM | POA: Diagnosis not present

## 2024-04-17 DIAGNOSIS — N289 Disorder of kidney and ureter, unspecified: Secondary | ICD-10-CM | POA: Diagnosis not present

## 2024-04-21 DIAGNOSIS — R5383 Other fatigue: Secondary | ICD-10-CM | POA: Diagnosis not present

## 2024-04-21 DIAGNOSIS — E538 Deficiency of other specified B group vitamins: Secondary | ICD-10-CM | POA: Diagnosis not present

## 2024-05-01 DIAGNOSIS — E559 Vitamin D deficiency, unspecified: Secondary | ICD-10-CM | POA: Diagnosis not present

## 2024-05-08 DIAGNOSIS — E538 Deficiency of other specified B group vitamins: Secondary | ICD-10-CM | POA: Diagnosis not present

## 2024-05-25 DIAGNOSIS — E538 Deficiency of other specified B group vitamins: Secondary | ICD-10-CM | POA: Diagnosis not present

## 2024-07-13 DIAGNOSIS — J0100 Acute maxillary sinusitis, unspecified: Secondary | ICD-10-CM | POA: Diagnosis not present

## 2024-07-17 DIAGNOSIS — L570 Actinic keratosis: Secondary | ICD-10-CM | POA: Diagnosis not present

## 2024-07-17 DIAGNOSIS — B078 Other viral warts: Secondary | ICD-10-CM | POA: Diagnosis not present

## 2024-07-17 DIAGNOSIS — E538 Deficiency of other specified B group vitamins: Secondary | ICD-10-CM | POA: Diagnosis not present

## 2024-07-17 DIAGNOSIS — E559 Vitamin D deficiency, unspecified: Secondary | ICD-10-CM | POA: Diagnosis not present

## 2024-07-17 DIAGNOSIS — L218 Other seborrheic dermatitis: Secondary | ICD-10-CM | POA: Diagnosis not present

## 2024-07-17 DIAGNOSIS — X32XXXA Exposure to sunlight, initial encounter: Secondary | ICD-10-CM | POA: Diagnosis not present
